# Patient Record
Sex: Male | Born: 1990 | Race: White | Hispanic: No | Marital: Single | Smoking: Current every day smoker
Health system: Southern US, Community
[De-identification: ages and names within clinical notes are randomized; demographics above are authoritative.]

## PROBLEM LIST (undated history)

## (undated) DIAGNOSIS — F32A Depression, unspecified: Secondary | ICD-10-CM

## (undated) DIAGNOSIS — E7800 Pure hypercholesterolemia, unspecified: Secondary | ICD-10-CM

## (undated) DIAGNOSIS — F329 Major depressive disorder, single episode, unspecified: Secondary | ICD-10-CM

## (undated) DIAGNOSIS — F419 Anxiety disorder, unspecified: Secondary | ICD-10-CM

## (undated) HISTORY — PX: KNEE SURGERY: SHX244

---

## 2013-11-20 ENCOUNTER — Encounter (HOSPITAL_BASED_OUTPATIENT_CLINIC_OR_DEPARTMENT_OTHER): Payer: Self-pay | Admitting: Emergency Medicine

## 2013-11-20 ENCOUNTER — Emergency Department (HOSPITAL_BASED_OUTPATIENT_CLINIC_OR_DEPARTMENT_OTHER)
Admission: EM | Admit: 2013-11-20 | Discharge: 2013-11-20 | Disposition: A | Discharge status: Home / Self Care | Payer: No Typology Code available for payment source | Attending: Emergency Medicine | Admitting: Emergency Medicine

## 2013-11-20 ENCOUNTER — Emergency Department (HOSPITAL_BASED_OUTPATIENT_CLINIC_OR_DEPARTMENT_OTHER): Payer: No Typology Code available for payment source

## 2013-11-20 DIAGNOSIS — S46909A Unspecified injury of unspecified muscle, fascia and tendon at shoulder and upper arm level, unspecified arm, initial encounter: Secondary | ICD-10-CM | POA: Insufficient documentation

## 2013-11-20 DIAGNOSIS — S79929A Unspecified injury of unspecified thigh, initial encounter: Secondary | ICD-10-CM

## 2013-11-20 DIAGNOSIS — Z8639 Personal history of other endocrine, nutritional and metabolic disease: Secondary | ICD-10-CM | POA: Insufficient documentation

## 2013-11-20 DIAGNOSIS — Z862 Personal history of diseases of the blood and blood-forming organs and certain disorders involving the immune mechanism: Secondary | ICD-10-CM | POA: Insufficient documentation

## 2013-11-20 DIAGNOSIS — Y9389 Activity, other specified: Secondary | ICD-10-CM | POA: Insufficient documentation

## 2013-11-20 DIAGNOSIS — M25511 Pain in right shoulder: Secondary | ICD-10-CM

## 2013-11-20 DIAGNOSIS — S79919A Unspecified injury of unspecified hip, initial encounter: Secondary | ICD-10-CM | POA: Insufficient documentation

## 2013-11-20 DIAGNOSIS — Y92410 Unspecified street and highway as the place of occurrence of the external cause: Secondary | ICD-10-CM | POA: Insufficient documentation

## 2013-11-20 DIAGNOSIS — S4980XA Other specified injuries of shoulder and upper arm, unspecified arm, initial encounter: Secondary | ICD-10-CM | POA: Insufficient documentation

## 2013-11-20 DIAGNOSIS — F17200 Nicotine dependence, unspecified, uncomplicated: Secondary | ICD-10-CM | POA: Insufficient documentation

## 2013-11-20 DIAGNOSIS — M25551 Pain in right hip: Secondary | ICD-10-CM

## 2013-11-20 DIAGNOSIS — Z79899 Other long term (current) drug therapy: Secondary | ICD-10-CM | POA: Insufficient documentation

## 2013-11-20 HISTORY — DX: Pure hypercholesterolemia, unspecified: E78.00

## 2013-11-20 MED ORDER — IBUPROFEN 200 MG PO TABS
600.0000 mg | ORAL_TABLET | Freq: Once | ORAL | Status: AC
  Administered 2013-11-20: 600 mg via ORAL
  Filled 2013-11-20 (×2): qty 1
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
## 2014-03-31 ENCOUNTER — Emergency Department (HOSPITAL_BASED_OUTPATIENT_CLINIC_OR_DEPARTMENT_OTHER)
Admission: EM | Admit: 2014-03-31 | Discharge: 2014-03-31 | Disposition: A | Discharge status: Home / Self Care | Payer: Medicaid Other | Attending: Emergency Medicine | Admitting: Emergency Medicine

## 2014-03-31 ENCOUNTER — Emergency Department (HOSPITAL_BASED_OUTPATIENT_CLINIC_OR_DEPARTMENT_OTHER): Payer: Medicaid Other

## 2014-03-31 ENCOUNTER — Encounter (HOSPITAL_BASED_OUTPATIENT_CLINIC_OR_DEPARTMENT_OTHER): Payer: Self-pay | Admitting: Emergency Medicine

## 2014-03-31 DIAGNOSIS — F17200 Nicotine dependence, unspecified, uncomplicated: Secondary | ICD-10-CM | POA: Diagnosis not present

## 2014-03-31 DIAGNOSIS — Z23 Encounter for immunization: Secondary | ICD-10-CM | POA: Insufficient documentation

## 2014-03-31 DIAGNOSIS — B353 Tinea pedis: Secondary | ICD-10-CM | POA: Insufficient documentation

## 2014-03-31 DIAGNOSIS — L03119 Cellulitis of unspecified part of limb: Secondary | ICD-10-CM

## 2014-03-31 DIAGNOSIS — L02619 Cutaneous abscess of unspecified foot: Secondary | ICD-10-CM | POA: Insufficient documentation

## 2014-03-31 DIAGNOSIS — E7800 Pure hypercholesterolemia, unspecified: Secondary | ICD-10-CM | POA: Insufficient documentation

## 2014-03-31 DIAGNOSIS — F3289 Other specified depressive episodes: Secondary | ICD-10-CM | POA: Insufficient documentation

## 2014-03-31 DIAGNOSIS — F411 Generalized anxiety disorder: Secondary | ICD-10-CM | POA: Diagnosis not present

## 2014-03-31 DIAGNOSIS — F329 Major depressive disorder, single episode, unspecified: Secondary | ICD-10-CM | POA: Diagnosis not present

## 2014-03-31 DIAGNOSIS — Z79899 Other long term (current) drug therapy: Secondary | ICD-10-CM | POA: Diagnosis not present

## 2014-03-31 HISTORY — DX: Anxiety disorder, unspecified: F41.9

## 2014-03-31 HISTORY — DX: Major depressive disorder, single episode, unspecified: F32.9

## 2014-03-31 HISTORY — DX: Depression, unspecified: F32.A

## 2014-03-31 LAB — BASIC METABOLIC PANEL
ANION GAP: 11 (ref 5–15)
BUN: 12 mg/dL (ref 6–23)
CHLORIDE: 101 meq/L (ref 96–112)
CO2: 25 meq/L (ref 19–32)
Calcium: 9.5 mg/dL (ref 8.4–10.5)
Creatinine, Ser: 0.8 mg/dL (ref 0.50–1.35)
GFR calc Af Amer: >90 mL/min (ref >90)
GFR calc non Af Amer: >90 mL/min (ref >90)
Glucose, Bld: 92 mg/dL (ref 70–99)
Potassium: 4.3 mEq/L (ref 3.7–5.3)
Sodium: 137 mEq/L (ref 137–147)

## 2014-03-31 LAB — CBC
HEMATOCRIT: 40.4 % (ref 39.0–52.0)
Hemoglobin: 13.9 g/dL (ref 13.0–17.0)
MCH: 32.7 pg (ref 26.0–34.0)
MCHC: 34.4 g/dL (ref 30.0–36.0)
MCV: 95.1 fL (ref 78.0–100.0)
Platelets: 320 10*3/uL (ref 150–400)
RBC: 4.25 MIL/uL (ref 4.22–5.81)
RDW: 12.4 % (ref 11.5–15.5)
WBC: 16.1 10*3/uL — AB (ref 4.0–10.5)

## 2014-03-31 MED ORDER — CLINDAMYCIN PHOSPHATE 600 MG/50ML IV SOLN
600.0000 mg | Freq: Once | INTRAVENOUS | Status: AC
  Administered 2014-03-31: 600 mg via INTRAVENOUS
  Filled 2014-03-31: qty 50

## 2014-03-31 MED ORDER — HYDROCODONE-ACETAMINOPHEN 5-325 MG PO TABS
2.0000 | ORAL_TABLET | Freq: Once | ORAL | Status: AC
  Administered 2014-03-31: 2 via ORAL
  Filled 2014-03-31: qty 2

## 2014-03-31 MED ORDER — TETANUS-DIPHTH-ACELL PERTUSSIS 5-2.5-18.5 LF-MCG/0.5 IM SUSP
0.5000 mL | Freq: Once | INTRAMUSCULAR | Status: AC
  Administered 2014-03-31: 0.5 mL via INTRAMUSCULAR
  Filled 2014-03-31: qty 0.5

## 2014-03-31 MED ORDER — CLINDAMYCIN HCL 300 MG PO CAPS: 300.0000 mg | ORAL_CAPSULE | Freq: Three times a day (TID) | ORAL | Status: DC

## 2014-03-31 MED ORDER — HYDROCODONE-ACETAMINOPHEN 5-325 MG PO TABS: 1.0000 | ORAL_TABLET | ORAL | Status: DC | PRN

## 2014-03-31 NOTE — Discharge Instructions (Signed)
Return here as needed for worsening redness or swelling Cellulitis Cellulitis is an infection of the skin and the tissue beneath it. The infected area is usually red and tender. Cellulitis occurs most often in the arms and lower legs.  CAUSES  Cellulitis is caused by bacteria that enter the skin through cracks or cuts in the skin. The most common types of bacteria that cause cellulitis are staphylococci and streptococci. SIGNS AND SYMPTOMS   Redness and warmth.  Swelling.  Tenderness or pain.  Fever. DIAGNOSIS  Your health care provider can usually determine what is wrong based on a physical exam. Blood tests may also be done. TREATMENT  Treatment usually involves taking an antibiotic medicine. HOME CARE INSTRUCTIONS   Take your antibiotic medicine as directed by your health care provider. Finish the antibiotic even if you start to feel better.  Keep the infected arm or leg elevated to reduce swelling.  Apply a warm cloth to the affected area up to 4 times per day to relieve pain.  Take medicines only as directed by your health care provider.  Keep all follow-up visits as directed by your health care provider. SEEK MEDICAL CARE IF:   You notice red streaks coming from the infected area.  Your red area gets larger or turns dark in color.  Your bone or joint underneath the infected area becomes painful after the skin has healed.  Your infection returns in the same area or another area.  You notice a swollen bump in the infected area.  You develop new symptoms.  You have a fever. SEEK IMMEDIATE MEDICAL CARE IF:   You feel very sleepy.  You develop vomiting or diarrhea.  You have a general ill feeling (malaise) with muscle aches and pains. MAKE SURE YOU:   Understand these instructions.  Will watch your condition.  Will get help right away if you are not doing well or get worse. Document Released: 03/30/2005 Document Revised: 11/04/2013 Document Reviewed:  09/05/2011 [facility] Patient Information 2015 [place], [place]. This information is not intended to replace advice given to you by your health care provider. Make sure you discuss any questions you have with your health care provider.

## 2014-03-31 NOTE — ED Provider Notes (Signed)
Medical screening examination/treatment/procedure(s) were performed by non-physician practitioner and as supervising physician I was immediately available for consultation/collaboration.   EKG Interpretation None       Ethelda Chick, MD 03/31/14 1239

## 2014-03-31 NOTE — ED Provider Notes (Signed)
CSN: [id number]     Arrival date & time 03/31/14  4098 History   First MD Initiated Contact with Patient 03/31/14 1034     Chief Complaint  Patient presents with  . Cellulitis     (Consider location/radiation/quality/duration/timing/severity/associated sxs/prior Treatment) HPI Comments: Pt states that his left foot developed redness swelling and warmth about 3 days ago. Pt states that he had bad athletes foot and he scratched the area. Denies diabetes. States that he also had a staple go in his left heel a couple of days ago. No history of similar symptoms  The history is provided by the patient. No language interpreter was used.    Past Medical History  Diagnosis Date  . High cholesterol   . Depression   . Anxiety    Past Surgical History  Procedure Laterality Date  . Knee surgery     No family history on file. History  Substance Use Topics  . Smoking status: Current Every Day Smoker    Types: Cigarettes  . Smokeless tobacco: Never Used  . Alcohol Use: No    Review of Systems  Constitutional: Negative.   Respiratory: Negative.   Cardiovascular: Negative.   All other systems reviewed and are negative.     Allergies  Bactrim; Imitrex; and Robaxin  Home Medications   Prior to Admission medications   Medication Sig Start Date End Date Taking? Authorizing Provider  ALPRAZolam Prudy Feeler) 0.5 MG tablet Take 0.5 mg by mouth 2 (two) times daily.    Historical Provider, MD  PARoxetine HCl (PAXIL PO) Take 50 mg by mouth daily.    Historical Provider, MD   BP 131/65  Pulse 75  Temp(Src) 98.5 F (36.9 C) (Oral)  Resp 16  Ht  (1.854 m)  Wt 145 lb (65.772 kg)  BMI 19.13 kg/m2  SpO2 99% Physical Exam  Nursing note and vitals reviewed. Constitutional: He is oriented to person, place, and time. He appears well-developed and well-nourished.  Cardiovascular: Normal rate and regular rhythm.   Pulmonary/Chest: Effort normal and breath sounds normal.  Musculoskeletal:  He exhibits tenderness.  Neurological: He is alert and oriented to person, place, and time. He exhibits normal muscle tone. Coordination normal.  Skin:  Obvious swelling and redness noted to the top of the left foot that originate between the third and fourth toe. No drainage noted. Pulses intact    ED Course  Procedures (including critical care time) Labs Review Labs Reviewed  CBC  BASIC METABOLIC PANEL    Imaging Review No results found.   EKG Interpretation None      MDM   Final diagnoses:  Cellulitis of foot   History of exam consistent with cellulitis. Discussed return precautions with pt. Will send home on clinda and hydrocodone    Teressa Lower, NP 03/31/14 1231

## 2014-03-31 NOTE — ED Notes (Signed)
Pt has red, swollen, inflammed area on top of left foot.  Pt states he scratched area and also stepped on a staple.  Pt states it is very painful.

## 2015-01-14 ENCOUNTER — Ambulatory Visit (HOSPITAL_BASED_OUTPATIENT_CLINIC_OR_DEPARTMENT_OTHER)
Admission: EM | Admit: 2015-01-14 | Discharge: 2015-01-14 | Disposition: A | Discharge status: Home / Self Care | Payer: Self-pay | Attending: Emergency Medicine | Admitting: Emergency Medicine

## 2015-01-14 ENCOUNTER — Emergency Department (HOSPITAL_COMMUNITY): Payer: Medicaid Other | Admitting: Certified Registered"

## 2015-01-14 ENCOUNTER — Emergency Department (HOSPITAL_BASED_OUTPATIENT_CLINIC_OR_DEPARTMENT_OTHER): Payer: Self-pay

## 2015-01-14 ENCOUNTER — Encounter (HOSPITAL_BASED_OUTPATIENT_CLINIC_OR_DEPARTMENT_OTHER): Payer: Self-pay

## 2015-01-14 ENCOUNTER — Encounter (HOSPITAL_COMMUNITY)
Admission: EM | Disposition: A | Discharge status: Home / Self Care | Payer: Self-pay | Source: Home / Self Care | Attending: Emergency Medicine

## 2015-01-14 ENCOUNTER — Emergency Department (HOSPITAL_COMMUNITY): Payer: Self-pay | Admitting: Certified Registered"

## 2015-01-14 DIAGNOSIS — F329 Major depressive disorder, single episode, unspecified: Secondary | ICD-10-CM | POA: Insufficient documentation

## 2015-01-14 DIAGNOSIS — F419 Anxiety disorder, unspecified: Secondary | ICD-10-CM | POA: Insufficient documentation

## 2015-01-14 DIAGNOSIS — M65141 Other infective (teno)synovitis, right hand: Secondary | ICD-10-CM | POA: Insufficient documentation

## 2015-01-14 DIAGNOSIS — M7989 Other specified soft tissue disorders: Secondary | ICD-10-CM

## 2015-01-14 DIAGNOSIS — F17210 Nicotine dependence, cigarettes, uncomplicated: Secondary | ICD-10-CM | POA: Insufficient documentation

## 2015-01-14 HISTORY — PX: I & D EXTREMITY: SHX5045

## 2015-01-14 LAB — CBC
HEMATOCRIT: 43.7 % (ref 39.0–52.0)
Hemoglobin: 15 g/dL (ref 13.0–17.0)
MCH: 32.4 pg (ref 26.0–34.0)
MCHC: 34.3 g/dL (ref 30.0–36.0)
MCV: 94.4 fL (ref 78.0–100.0)
PLATELETS: 320 10*3/uL (ref 150–400)
RBC: 4.63 MIL/uL (ref 4.22–5.81)
RDW: 12.3 % (ref 11.5–15.5)
WBC: 11.3 10*3/uL — AB (ref 4.0–10.5)

## 2015-01-14 LAB — GRAM STAIN

## 2015-01-14 LAB — BASIC METABOLIC PANEL
ANION GAP: 8 (ref 5–15)
BUN: 12 mg/dL (ref 6–20)
CALCIUM: 9.7 mg/dL (ref 8.9–10.3)
CO2: 28 mmol/L (ref 22–32)
Chloride: 103 mmol/L (ref 101–111)
Creatinine, Ser: 0.89 mg/dL (ref 0.61–1.24)
GFR calc Af Amer: >60 mL/min (ref >60)
GFR calc non Af Amer: >60 mL/min (ref >60)
Glucose, Bld: 100 mg/dL — ABNORMAL HIGH (ref 65–99)
Potassium: 4.4 mmol/L (ref 3.5–5.1)
Sodium: 139 mmol/L (ref 135–145)

## 2015-01-14 SURGERY — IRRIGATION AND DEBRIDEMENT EXTREMITY
Anesthesia: General | Site: Hand | Laterality: Right

## 2015-01-14 MED ORDER — HYDROMORPHONE HCL 1 MG/ML IJ SOLN
INTRAMUSCULAR | Status: AC
  Filled 2015-01-14: qty 1

## 2015-01-14 MED ORDER — MORPHINE SULFATE 4 MG/ML IJ SOLN
4.0000 mg | Freq: Once | INTRAMUSCULAR | Status: AC
  Administered 2015-01-14: 4 mg via INTRAVENOUS
  Filled 2015-01-14: qty 1

## 2015-01-14 MED ORDER — ONDANSETRON HCL 4 MG/2ML IJ SOLN
INTRAMUSCULAR | Status: AC
  Filled 2015-01-14: qty 2

## 2015-01-14 MED ORDER — LACTATED RINGERS IV SOLN
INTRAVENOUS | Status: DC | PRN
  Administered 2015-01-14: 18:00:00 via INTRAVENOUS

## 2015-01-14 MED ORDER — PROPOFOL 10 MG/ML IV BOLUS
INTRAVENOUS | Status: AC
  Filled 2015-01-14: qty 20

## 2015-01-14 MED ORDER — OXYCODONE HCL 5 MG PO TABS
5.0000 mg | ORAL_TABLET | Freq: Once | ORAL | Status: AC | PRN
  Administered 2015-01-14: 5 mg via ORAL

## 2015-01-14 MED ORDER — SODIUM CHLORIDE 0.9 % IR SOLN
Status: DC | PRN
  Administered 2015-01-14: 1000 mL

## 2015-01-14 MED ORDER — LIDOCAINE HCL (CARDIAC) 20 MG/ML IV SOLN
INTRAVENOUS | Status: DC | PRN
  Administered 2015-01-14: 60 mg via INTRAVENOUS

## 2015-01-14 MED ORDER — LIDOCAINE HCL (CARDIAC) 20 MG/ML IV SOLN
INTRAVENOUS | Status: AC
  Filled 2015-01-14: qty 5

## 2015-01-14 MED ORDER — BUPIVACAINE HCL (PF) 0.25 % IJ SOLN
INTRAMUSCULAR | Status: DC | PRN
  Administered 2015-01-14: 3.5 mL

## 2015-01-14 MED ORDER — FENTANYL CITRATE (PF) 250 MCG/5ML IJ SOLN
INTRAMUSCULAR | Status: AC
  Filled 2015-01-14: qty 5

## 2015-01-14 MED ORDER — OXYCODONE HCL 5 MG PO TABS
ORAL_TABLET | ORAL | Status: AC
  Filled 2015-01-14: qty 1

## 2015-01-14 MED ORDER — PROMETHAZINE HCL 25 MG/ML IJ SOLN: 6.2500 mg | INTRAMUSCULAR | Status: DC | PRN

## 2015-01-14 MED ORDER — OXYCODONE HCL 5 MG/5ML PO SOLN: 5.0000 mg | Freq: Once | ORAL | Status: AC | PRN

## 2015-01-14 MED ORDER — MIDAZOLAM HCL 5 MG/5ML IJ SOLN
INTRAMUSCULAR | Status: DC | PRN
  Administered 2015-01-14: 2 mg via INTRAVENOUS

## 2015-01-14 MED ORDER — HYDROMORPHONE HCL 1 MG/ML IJ SOLN
0.2500 mg | INTRAMUSCULAR | Status: DC | PRN
  Administered 2015-01-14 (×4): 0.5 mg via INTRAVENOUS

## 2015-01-14 MED ORDER — FENTANYL CITRATE (PF) 100 MCG/2ML IJ SOLN
INTRAMUSCULAR | Status: DC | PRN
  Administered 2015-01-14: 50 ug via INTRAVENOUS
  Administered 2015-01-14 (×2): 100 ug via INTRAVENOUS

## 2015-01-14 MED ORDER — OXYCODONE-ACETAMINOPHEN 5-325 MG PO TABS: 1.0000 | ORAL_TABLET | ORAL | Status: DC | PRN

## 2015-01-14 MED ORDER — PROPOFOL 10 MG/ML IV BOLUS
INTRAVENOUS | Status: DC | PRN
  Administered 2015-01-14: 200 mg via INTRAVENOUS

## 2015-01-14 MED ORDER — MIDAZOLAM HCL 2 MG/2ML IJ SOLN
INTRAMUSCULAR | Status: AC
  Filled 2015-01-14: qty 2

## 2015-01-14 MED ORDER — BUPIVACAINE HCL (PF) 0.25 % IJ SOLN
INTRAMUSCULAR | Status: AC
  Filled 2015-01-14: qty 30

## 2015-01-14 MED ORDER — ARTIFICIAL TEARS OP OINT
TOPICAL_OINTMENT | OPHTHALMIC | Status: AC
  Filled 2015-01-14: qty 3.5

## 2015-01-14 MED ORDER — ONDANSETRON HCL 4 MG/2ML IJ SOLN
INTRAMUSCULAR | Status: DC | PRN
  Administered 2015-01-14: 4 mg via INTRAVENOUS

## 2015-01-14 SURGICAL SUPPLY — 51 items
BANDAGE ELASTIC 3 VELCRO ST LF (GAUZE/BANDAGES/DRESSINGS) IMPLANT
BANDAGE ELASTIC 4 VELCRO ST LF (GAUZE/BANDAGES/DRESSINGS) IMPLANT
BNDG CMPR 9X4 STRL LF SNTH (GAUZE/BANDAGES/DRESSINGS) ×1
BNDG COHESIVE 1X5 TAN STRL LF (GAUZE/BANDAGES/DRESSINGS) ×3 IMPLANT
BNDG CONFORM 2 STRL LF (GAUZE/BANDAGES/DRESSINGS) IMPLANT
BNDG ESMARK 4X9 LF (GAUZE/BANDAGES/DRESSINGS) ×3 IMPLANT
BNDG GAUZE ELAST 4 BULKY (GAUZE/BANDAGES/DRESSINGS) IMPLANT
CORDS BIPOLAR (ELECTRODE) ×3 IMPLANT
COVER SURGICAL LIGHT HANDLE (MISCELLANEOUS) ×3 IMPLANT
CUFF TOURNIQUET SINGLE 18IN (TOURNIQUET CUFF) ×3 IMPLANT
DECANTER SPIKE VIAL GLASS SM (MISCELLANEOUS) IMPLANT
DRAIN PENROSE 1/4X12 LTX STRL (WOUND CARE) IMPLANT
DRAPE SURG 17X23 STRL (DRAPES) IMPLANT
DRSG PAD ABDOMINAL 8X10 ST (GAUZE/BANDAGES/DRESSINGS) IMPLANT
DURAPREP 26ML APPLICATOR (WOUND CARE) ×3 IMPLANT
ELECT REM PT RETURN 9FT ADLT (ELECTROSURGICAL)
ELECTRODE REM PT RTRN 9FT ADLT (ELECTROSURGICAL) IMPLANT
GAUZE PACKING IODOFORM 1/4X5 (PACKING) IMPLANT
GAUZE SPONGE 4X4 12PLY STRL (GAUZE/BANDAGES/DRESSINGS) IMPLANT
GAUZE XEROFORM 1X8 LF (GAUZE/BANDAGES/DRESSINGS) ×3 IMPLANT
GLOVE SURG SYN 8.0 (GLOVE) ×3 IMPLANT
GOWN STRL REUS W/ TWL LRG LVL3 (GOWN DISPOSABLE) ×1 IMPLANT
GOWN STRL REUS W/ TWL XL LVL3 (GOWN DISPOSABLE) ×1 IMPLANT
GOWN STRL REUS W/TWL LRG LVL3 (GOWN DISPOSABLE) ×3
GOWN STRL REUS W/TWL XL LVL3 (GOWN DISPOSABLE) ×3
HANDPIECE INTERPULSE COAX TIP (DISPOSABLE)
KIT BASIN OR (CUSTOM PROCEDURE TRAY) ×3 IMPLANT
KIT ROOM TURNOVER OR (KITS) ×3 IMPLANT
MANIFOLD NEPTUNE II (INSTRUMENTS) IMPLANT
NEEDLE HYPO 25GX1X1/2 BEV (NEEDLE) IMPLANT
NEEDLE HYPO 25X1 1.5 SAFETY (NEEDLE) ×3 IMPLANT
NS IRRIG 1000ML POUR BTL (IV SOLUTION) ×3 IMPLANT
PACK ORTHO EXTREMITY (CUSTOM PROCEDURE TRAY) ×3 IMPLANT
PAD ARMBOARD 7.5X6 YLW CONV (MISCELLANEOUS) ×6 IMPLANT
PAD CAST 4YDX4 CTTN HI CHSV (CAST SUPPLIES) IMPLANT
PADDING CAST COTTON 4X4 STRL (CAST SUPPLIES)
SET HNDPC FAN SPRY TIP SCT (DISPOSABLE) IMPLANT
SPONGE GAUZE 4X4 12PLY STER LF (GAUZE/BANDAGES/DRESSINGS) ×3 IMPLANT
SPONGE LAP 18X18 X RAY DECT (DISPOSABLE) IMPLANT
SUCTION FRAZIER TIP 10 FR DISP (SUCTIONS) ×3 IMPLANT
SUT VICRYL RAPIDE 4/0 PS 2 (SUTURE) IMPLANT
SYR 20CC LL (SYRINGE) IMPLANT
SYR CONTROL 10ML LL (SYRINGE) ×3 IMPLANT
TOWEL OR 17X24 6PK STRL BLUE (TOWEL DISPOSABLE) ×3 IMPLANT
TOWEL OR 17X26 10 PK STRL BLUE (TOWEL DISPOSABLE) ×3 IMPLANT
TUBE ANAEROBIC SPECIMEN COL (MISCELLANEOUS) ×3 IMPLANT
TUBE CONNECTING 12'X1/4 (SUCTIONS) ×1
TUBE CONNECTING 12X1/4 (SUCTIONS) ×2 IMPLANT
UNDERPAD 30X30 INCONTINENT (UNDERPADS AND DIAPERS) ×3 IMPLANT
WATER STERILE IRR 1000ML POUR (IV SOLUTION) IMPLANT
YANKAUER SUCT BULB TIP NO VENT (SUCTIONS) IMPLANT

## 2015-01-14 NOTE — ED Notes (Signed)
Patient transported to X-ray ambulatory with tech. 

## 2015-01-14 NOTE — Transfer of Care (Signed)
3Immediate Anesthesia Transfer of Care Note  Patient: Keith Daugherty  Procedure(s) Performed: Procedure(s): Incision and Drainage RIGHT INDEX FINGER (Right)  Patient Location: PACU  Anesthesia Type:General  Level of Consciousness: awake, alert , oriented and patient cooperative  Airway & Oxygen Therapy: Patient Spontanous Breathing  Post-op Assessment: Report given to RN, Post -op Vital signs reviewed and stable, Patient moving all extremities and Patient moving all extremities X 4  Post vital signs: Reviewed and stable  Last Vitals:  Filed Vitals:   01/14/15 1900  BP:   Pulse:   Temp: 36.9 C  Resp:     Complications: No apparent anesthesia complications

## 2015-01-14 NOTE — ED Provider Notes (Signed)
Patient transferred here from [facility] with infection in the right second finger here to see Dr. Mina MarbleWeingold. He is resting comfortably and in no acute distress. He is noted to have significant swelling of the volar surface of the right second finger middle phalanx. Dr. Mina MarbleWeingold will come to see the patient.  Dione Boozeavid Lamika Connolly, MD 01/14/15 1739
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
## 2015-01-14 NOTE — ED Notes (Signed)
Pt will be [facility] Ed at [facility] by private [facility].

## 2015-01-14 NOTE — ED Provider Notes (Signed)
CSN: [id number]     Arrival date & time 01/14/15  1336 History   First MD Initiated Contact with Patient 01/14/15 1352     Chief Complaint  Patient presents with  . Finger Injury     (Consider location/radiation/quality/duration/timing/severity/associated sxs/prior Treatment) HPI Comments: 24 y/o M complaining of right index finger pain and swelling 2 months. States he was working on a garage and believes he got a piece of metal or fiberglass in his finger. States he could not miss work to be evaluated initially. He has been taking leftover penicillin that he had at home (completed yesterday) and ibuprofen with minimal relief. Reports pain when he flexes and extends his finger, rated 6/10. About a week ago he had a small amount of purulent drainage. Admits to intermittent subjective fevers.  Pt's sig other in exam room interrupting and stating different story about timing of when he last ate (she states last night, pt states this morning) and fevers (he states he did not check it, she states it was 102).  The history is provided by the patient.    Past Medical History  Diagnosis Date  . High cholesterol   . Depression   . Anxiety    Past Surgical History  Procedure Laterality Date  . Knee surgery     No family history on file. History  Substance Use Topics  . Smoking status: Current Every Day Smoker    Types: Cigarettes  . Smokeless tobacco: Never Used  . Alcohol Use: No    Review of Systems  Constitutional: Positive for fever.  Musculoskeletal:       + R index finger pain and swelling.  All other systems reviewed and are negative.     Allergies  Bactrim; Imitrex; Robaxin; and Toradol  Home Medications   Prior to Admission medications   Medication Sig Start Date End Date Taking? Authorizing Provider  ALPRAZolam Prudy Feeler) 0.5 MG tablet Take 0.5 mg by mouth 2 (two) times daily.    Historical Provider, MD  PARoxetine HCl (PAXIL PO) Take 50 mg by mouth daily.     Historical Provider, MD   BP 129/91 mmHg  Pulse 97  Temp(Src) 98.9 F (37.2 C) (Oral)  Resp 16  Ht  (1.88 m)  Wt 148 lb (67.132 kg)  BMI 18.99 kg/m2  SpO2 99% Physical Exam  Constitutional: He is oriented to person, place, and time. He appears well-developed and well-nourished. No distress.  HENT:  Head: Normocephalic and atraumatic.  Eyes: Conjunctivae and EOM are normal.  Neck: Normal range of motion. Neck supple.  Cardiovascular: Normal rate, regular rhythm and normal heart sounds.   Pulmonary/Chest: Effort normal and breath sounds normal.  Musculoskeletal:  Area of swelling to palmar aspect of R index finger proximal phalanx. Slight fluctuance. Central callous appearance. Able to fully flex and extend finger with pain. Pain increased against resistance of flexion. Cap refill < 2 seconds.  Neurological: He is alert and oriented to person, place, and time.  Skin: Skin is warm and dry.  Psychiatric: He has a normal mood and affect. His behavior is normal.  Nursing note and vitals reviewed.   ED Course  Procedures (including critical care time) Labs Review Labs Reviewed  CBC  BASIC METABOLIC PANEL    Imaging Review Dg Finger Index Right  01/14/2015   CLINICAL DATA:  Finger injury 2 months ago, possible foreign body  EXAM: RIGHT INDEX FINGER 2+V  COMPARISON:  None.  FINDINGS: Three views of the right second  finger submitted. There is soft tissue swelling adjacent to proximal phalanx. No acute fracture or subluxation. No radiopaque foreign body is identified.  IMPRESSION: No acute fracture or subluxation. Soft tissue swelling adjacent to proximal phalanx. No radiopaque foreign body is identified.   Electronically Signed   By: Natasha MeadLiviu  Pop M.D.   On: 01/14/2015 14:05     EKG Interpretation None      MDM   Final diagnoses:  Swelling of finger, right   Non-toxic appearing, NAD. AFVSS. No FB seen on xray. No FB palpated on exam. Large area of swelling to proximal  phalanx ventrally. Pain against resistance with flexion. Cannot rule out flexor tendon involvement. I spoke with Dr. Mina MarbleWeingold who suggests transferring pt to [facility] ED for eval. CBC, BMP pending. Pt will go private vehicle. I spoke with Dr. Juleen ChinaKohut who accepts pt in transfer. Pt NPO.  Discussed with attending Dr. Madilyn Hookees who also evaluated patient and agrees with plan of care.  Kathrynn SpeedRobyn M Isadora Delorey, PA-C 01/14/15 1540  [address]obyn M Madelynn Malson, PA-C 01/14/15 1546  Tilden FossaElizabeth Rees, MD 01/15/15 [phone]

## 2015-01-14 NOTE — Anesthesia Postprocedure Evaluation (Signed)
  Anesthesia Post-op Note  Patient: Keith Daugherty  Procedure(s) Performed: Procedure(s): Incision and Drainage RIGHT INDEX FINGER (Right)  Patient Location: PACU  Anesthesia Type:General  Level of Consciousness: awake, alert  and oriented  Airway and Oxygen Therapy: Patient Spontanous Breathing  Post-op Pain: mild  Post-op Assessment: Post-op Vital signs reviewed              Post-op Vital Signs: Reviewed  Last Vitals:  Filed Vitals:   01/14/15 1945  BP: 133/80  Pulse: 74  Temp: 36.7 C  Resp: 20    Complications: No apparent anesthesia complications

## 2015-01-14 NOTE — ED Notes (Signed)
Pt made aware of awaiting return phone call from hand surgery.

## 2015-01-14 NOTE — Op Note (Signed)
See note [id number]

## 2015-01-14 NOTE — Anesthesia Procedure Notes (Signed)
Procedure Name: LMA Insertion Date/Time: 01/14/2015 6:29 PM Performed by: Jefm MilesENNIE, Xaria Judon E Pre-anesthesia Checklist: Patient identified, Emergency Drugs available, Suction available, Patient being monitored and Timeout performed Patient Re-evaluated:Patient Re-evaluated prior to inductionOxygen Delivery Method: Circle system utilized Preoxygenation: Pre-oxygenation with 100% oxygen Ventilation: Mask ventilation without difficulty LMA: LMA inserted LMA Size: 5.0 Number of attempts: 1 Placement Confirmation: positive ETCO2 and breath sounds checked- equal and bilateral Tube secured with: Tape Dental Injury: Teeth and Oropharynx as per pre-operative assessment

## 2015-01-14 NOTE — ED Notes (Signed)
Right index finger injury and possible metal in finger x 2 months

## 2015-01-14 NOTE — ED Notes (Signed)
Report called to MusicianJessica RN at [facility].

## 2015-01-14 NOTE — Anesthesia Preprocedure Evaluation (Addendum)
Anesthesia Evaluation  Patient identified by MRN, date of birth, ID band Patient awake    Reviewed: Allergy & Precautions, NPO status , Patient's Chart, lab work & pertinent test results  Airway Mallampati: I  TM Distance: >3 FB Neck ROM: Full    Dental  (+) Teeth Intact, Dental Advisory Given   Pulmonary Current Smoker,  breath sounds clear to auscultation        Cardiovascular negative cardio ROS  Rhythm:Regular Rate:Normal     Neuro/Psych Anxiety Depression negative neurological ROS     GI/Hepatic negative GI ROS, Neg liver ROS,   Endo/Other  negative endocrine ROS  Renal/GU negative Renal ROS     Musculoskeletal   Abdominal   Peds  Hematology negative hematology ROS (+)   Anesthesia Other Findings   Reproductive/Obstetrics                           Anesthesia Physical Anesthesia Plan  ASA: II and emergent  Anesthesia Plan: General   Post-op Pain Management:    Induction: Intravenous  Airway Management Planned: LMA  Additional Equipment:   Intra-op Plan:   Post-operative Plan: Extubation in OR  Informed Consent: I have reviewed the patients History and Physical, chart, labs and discussed the procedure including the risks, benefits and alternatives for the proposed anesthesia with the patient or authorized representative who has indicated his/her understanding and acceptance.   Dental advisory given  Plan Discussed with: CRNA  Anesthesia Plan Comments:        Anesthesia Quick Evaluation

## 2015-01-15 ENCOUNTER — Encounter (HOSPITAL_COMMUNITY): Payer: Self-pay | Admitting: Orthopedic Surgery

## 2015-01-15 NOTE — Op Note (Signed)
NAMEduard Daugherty:  Keith Daugherty, Keith Daugherty               ACCOUNT NO.:  [id number]  MEDICAL RECORD NO.:  [id number]  LOCATION:  [facility]                         FACILITY:  [facility]  PHYSICIAN:  Artist PaisMatthew A. Mar Walmer, M.D.DATE OF BIRTH:  [birth year]  DATE OF PROCEDURE:  01/14/2015 DATE OF DISCHARGE:  01/14/2015                              OPERATIVE REPORT   PREOPERATIVE DIAGNOSIS:  Right index finger flexor sheath infection.  POSTOPERATIVE DIAGNOSIS:  Right index finger flexor sheath infection.  PROCEDURE:  Incision and drainage of right index finger flexor sheath.  SURGEON:  Artist PaisMatthew A. Mina MarbleWeingold, M.D.  ASSISTANT:  None.  ANESTHESIA:  General.  COMPLICATIONS:  No complications.  DRAINS:  No drains.  CULTURES:  X2 and Gram stain sent.  DESCRIPTION OF PROCEDURE:  The patient was taken to the operating suite. After induction of adequate general anesthetic, right upper extremity was prepped and draped in usual sterile fashion.  An Esmarch was used to exsanguinate the limb.  Tourniquet was inflated to 250 mmHg.  At this point in time, an oblique incision was made at the index finger on the right side, trying proximal radial and going distal ulna from the MP flexion crease to the PIP flexion crease.  Skin was incised.  Purulence was encountered over the flexor sheath.  Neurovascular bundle was identified and retracted.  There was a small bit of infection in the distal aspect of flexor sheath distal to the A2 __________ A2 and A4. This was carefully irrigated out with a liter of normal saline.  We fully flexed and extended the finger.  There was no other obvious __________ infection.  The wound was then packed open with 1 x 8 Xeroform gauze, 4x4s and Coban wrap.  The patient tolerated the procedure well, went to the recovery room in stable fashion.     Artist PaisMatthew A. Mina MarbleWeingold, M.D.    MAW/MEDQ  D:  01/14/2015  T:  01/15/2015  Job:  [id number]

## 2015-01-16 LAB — WOUND CULTURE

## 2015-01-19 LAB — ANAEROBIC CULTURE

## 2015-03-11 ENCOUNTER — Emergency Department (HOSPITAL_COMMUNITY): Payer: Medicaid Other

## 2015-03-11 ENCOUNTER — Encounter (HOSPITAL_COMMUNITY): Payer: Self-pay | Admitting: Emergency Medicine

## 2015-03-11 ENCOUNTER — Emergency Department (HOSPITAL_COMMUNITY)
Admission: EM | Admit: 2015-03-11 | Discharge: 2015-03-11 | Disposition: A | Discharge status: Home / Self Care | Payer: Medicaid Other | Attending: Emergency Medicine | Admitting: Emergency Medicine

## 2015-03-11 DIAGNOSIS — W19XXXA Unspecified fall, initial encounter: Secondary | ICD-10-CM

## 2015-03-11 DIAGNOSIS — E782 Mixed hyperlipidemia: Secondary | ICD-10-CM | POA: Insufficient documentation

## 2015-03-11 DIAGNOSIS — Z720 Tobacco use: Secondary | ICD-10-CM | POA: Insufficient documentation

## 2015-03-11 DIAGNOSIS — M25551 Pain in right hip: Secondary | ICD-10-CM

## 2015-03-11 DIAGNOSIS — S6991XA Unspecified injury of right wrist, hand and finger(s), initial encounter: Secondary | ICD-10-CM | POA: Insufficient documentation

## 2015-03-11 DIAGNOSIS — Y998 Other external cause status: Secondary | ICD-10-CM | POA: Insufficient documentation

## 2015-03-11 DIAGNOSIS — F419 Anxiety disorder, unspecified: Secondary | ICD-10-CM | POA: Insufficient documentation

## 2015-03-11 DIAGNOSIS — S79911A Unspecified injury of right hip, initial encounter: Secondary | ICD-10-CM | POA: Insufficient documentation

## 2015-03-11 DIAGNOSIS — M25531 Pain in right wrist: Secondary | ICD-10-CM

## 2015-03-11 DIAGNOSIS — Y9289 Other specified places as the place of occurrence of the external cause: Secondary | ICD-10-CM | POA: Insufficient documentation

## 2015-03-11 DIAGNOSIS — Z79899 Other long term (current) drug therapy: Secondary | ICD-10-CM | POA: Insufficient documentation

## 2015-03-11 DIAGNOSIS — S50811A Abrasion of right forearm, initial encounter: Secondary | ICD-10-CM | POA: Insufficient documentation

## 2015-03-11 DIAGNOSIS — S3992XA Unspecified injury of lower back, initial encounter: Secondary | ICD-10-CM | POA: Insufficient documentation

## 2015-03-11 DIAGNOSIS — Y9389 Activity, other specified: Secondary | ICD-10-CM | POA: Insufficient documentation

## 2015-03-11 DIAGNOSIS — F329 Major depressive disorder, single episode, unspecified: Secondary | ICD-10-CM | POA: Insufficient documentation

## 2015-03-11 DIAGNOSIS — W11XXXA Fall on and from ladder, initial encounter: Secondary | ICD-10-CM | POA: Insufficient documentation

## 2015-03-11 LAB — COMPREHENSIVE METABOLIC PANEL
ALT: 29 U/L (ref 17–63)
AST: 37 U/L (ref 15–41)
Albumin: 4.4 g/dL (ref 3.5–5.0)
Alkaline Phosphatase: 78 U/L (ref 38–126)
Anion gap: 9 (ref 5–15)
BILIRUBIN TOTAL: 0.6 mg/dL (ref 0.3–1.2)
BUN: 8 mg/dL (ref 6–20)
CHLORIDE: 103 mmol/L (ref 101–111)
CO2: 26 mmol/L (ref 22–32)
CREATININE: 0.95 mg/dL (ref 0.61–1.24)
Calcium: 9.7 mg/dL (ref 8.9–10.3)
Glucose, Bld: 91 mg/dL (ref 65–99)
Potassium: 3.5 mmol/L (ref 3.5–5.1)
Sodium: 138 mmol/L (ref 135–145)
TOTAL PROTEIN: 7.3 g/dL (ref 6.5–8.1)

## 2015-03-11 LAB — ETHANOL

## 2015-03-11 LAB — CBC
HCT: 40.8 % (ref 39.0–52.0)
Hemoglobin: 14 g/dL (ref 13.0–17.0)
MCH: 32.2 pg (ref 26.0–34.0)
MCHC: 34.3 g/dL (ref 30.0–36.0)
MCV: 93.8 fL (ref 78.0–100.0)
PLATELETS: 350 10*3/uL (ref 150–400)
RBC: 4.35 MIL/uL (ref 4.22–5.81)
RDW: 12.7 % (ref 11.5–15.5)
WBC: 11.6 10*3/uL — AB (ref 4.0–10.5)

## 2015-03-11 LAB — ABO/RH: ABO/RH(D): A POS

## 2015-03-11 LAB — CDS SEROLOGY

## 2015-03-11 LAB — PROTIME-INR
INR: 0.96 (ref 0.00–1.49)
PROTHROMBIN TIME: 13 s (ref 11.6–15.2)

## 2015-03-11 LAB — TYPE AND SCREEN
ABO/RH(D): A POS
Antibody Screen: NEGATIVE

## 2015-03-11 MED ORDER — OXYCODONE-ACETAMINOPHEN 5-325 MG PO TABS: 1.0000 | ORAL_TABLET | ORAL | Status: DC | PRN

## 2015-03-11 MED ORDER — HYDROMORPHONE HCL 1 MG/ML IJ SOLN
1.0000 mg | Freq: Once | INTRAMUSCULAR | Status: AC
  Administered 2015-03-11: 1 mg via INTRAVENOUS
  Filled 2015-03-11: qty 1

## 2015-03-11 MED ORDER — OXYCODONE-ACETAMINOPHEN 5-325 MG PO TABS
2.0000 | ORAL_TABLET | Freq: Once | ORAL | Status: AC
  Administered 2015-03-11: 2 via ORAL
  Filled 2015-03-11: qty 2

## 2015-03-11 NOTE — Progress Notes (Signed)
Chaplain responded to level II trauma.  Pt fell from ladder at work.  Chaplain met pt's family outside of room, RN said family could now return.  Chaplain provided emotional support for pt and family at bedside and is available to follow up as needed.    03/11/15 2000  Clinical Encounter Type  Visited With Patient and family together;Health care provider  Visit Type Initial;Trauma  Spiritual Encounters  Spiritual Needs Emotional  Stress Factors  Patient Stress Factors Health changes  Family Stress Factors None identified   Geralyn Flash 03/11/2015 8:56 PM

## 2015-03-11 NOTE — ED Notes (Signed)
Pt transported to xray 

## 2015-03-11 NOTE — ED Provider Notes (Signed)
CSN: [id number]     Arrival date & time 03/11/15  1652 History   First MD Initiated Contact with Patient 03/11/15 1709     Chief Complaint  Patient presents with  . Fall  . Trauma   Patient is a 24 y.o. male presenting with general illness. The history is provided by the patient. No language interpreter was used.  Illness Location:  Back & R hip Quality:  Pain Severity:  Moderate Onset quality:  Sudden Timing:  Constant Progression:  Unchanged Chronicity:  New Context:  Fall. Patient states that earlier today he was on a 20 foot ladder when the ladder gave way and he fell impacting his right hip and lower back on concrete. Pt denies hitting head, LOC, amnesia to event, HA, vision changes, N/V, numbness, tingling, and focal weakness. No Hx of bleeding disorder. Pt is not taking anticoagulants or antiplatelets. Patient has been hematuria since then. Complaining of pain in right hip and lower back. Denies previous history of leg/back fractures or surgeries. Associated symptoms: no abdominal pain, no chest pain, no diarrhea, no fever, no headaches, no loss of consciousness, no nausea and no shortness of breath     Past Medical History  Diagnosis Date  . High cholesterol   . Depression   . Anxiety    Past Surgical History  Procedure Laterality Date  . Knee surgery    . I&d extremity Right 01/14/2015    Procedure: Incision and Drainage RIGHT INDEX FINGER;  Surgeon: Dairl Ponder, MD;  Location: [facility] OR;  Service: Orthopedics;  Laterality: Right;   No family history on file. Social History  Substance Use Topics  . Smoking status: Current Every Day Smoker -- 0.50 packs/day    Types: Cigarettes  . Smokeless tobacco: Never Used  . Alcohol Use: No    Review of Systems  Constitutional: Negative for fever and chills.  Respiratory: Negative for shortness of breath.   Cardiovascular: Negative for chest pain.  Gastrointestinal: Negative for nausea, abdominal pain and diarrhea.   Musculoskeletal: Positive for back pain and arthralgias.  Neurological: Negative for loss of consciousness, syncope and headaches.  All other systems reviewed and are negative.   Allergies  Bactrim; Imitrex; Robaxin; and Toradol  Home Medications   Prior to Admission medications   Medication Sig Start Date End Date Taking? Authorizing Provider  ALPRAZolam Prudy Feeler) 0.5 MG tablet Take 0.5 mg by mouth 2 (two) times daily.    Historical Provider, MD  oxyCODONE-acetaminophen (ROXICET) 5-325 MG per tablet Take 1 tablet by mouth every 4 (four) hours as needed for severe pain. 03/11/15   Angelina Ok, MD  PARoxetine HCl (PAXIL PO) Take 50 mg by mouth daily.    Historical Provider, MD   BP 134/90 mmHg  Pulse 75  Temp(Src) 98.4 F (36.9 C) (Oral)  Resp 12  Ht  (1.854 m)  Wt 165 lb (74.844 kg)  BMI 21.77 kg/m2  SpO2 100%   Physical Exam  Constitutional: He is oriented to person, place, and time. He appears well-developed and well-nourished.  HENT:  Head: Normocephalic and atraumatic.  Eyes: Conjunctivae are normal. Pupils are equal, round, and reactive to light.  Neck: Normal range of motion. Neck supple.  Cardiovascular: Normal rate, regular rhythm and intact distal pulses.   Pulmonary/Chest: Effort normal and breath sounds normal.  Abdominal: Soft. Bowel sounds are normal.  Musculoskeletal: Normal range of motion.  No C-spine midline tenderness. Midline tenderness of T and L-spine. Tenderness to palpation of anterior and lateral  right hip. Neurovascularly intact distally with full range of motion. There is palpation of right olecranon and right wrist  Neurological: He is alert and oriented to person, place, and time. He has normal reflexes. He displays no atrophy, no tremor and normal reflexes. No cranial nerve deficit or sensory deficit. He exhibits normal muscle tone. He displays no seizure activity. Coordination normal.  Reflex Scores:      Tricep reflexes are 2+ on the right  side and 2+ on the left side.      Bicep reflexes are 2+ on the right side and 2+ on the left side.      Brachioradialis reflexes are 2+ on the right side and 2+ on the left side.      Patellar reflexes are 2+ on the right side and 2+ on the left side.      Achilles reflexes are 2+ on the right side and 2+ on the left side. Skin: Skin is warm and dry.  Abrasion to posterior right forearm as well as anterior lotion, no obvious hematoma or ecchymoses or laceration  Nursing note and vitals reviewed.   ED Course  Procedures   Labs Review Labs Reviewed  CBC - Abnormal; Notable for the following:    WBC 11.6 (*)    All other components within normal limits  CDS SEROLOGY  COMPREHENSIVE METABOLIC PANEL  ETHANOL  PROTIME-INR  TYPE AND SCREEN  ABO/RH   Imaging Review Dg Thoracic Spine 2 View  03/11/2015   CLINICAL DATA:  Status post 20 foot fall from a ladder today. Thoracic spine pain. Initial encounter.  EXAM: THORACIC SPINE 2 VIEWS  COMPARISON:  None.  FINDINGS: Images include a swimmer's view. There is no evidence of thoracic spine fracture. Alignment is normal. No other significant bone abnormalities are identified.  IMPRESSION: Negative exam.   Electronically Signed   By: Drusilla Kanner M.D.   On: 03/11/2015 18:53   Dg Lumbar Spine Complete  03/11/2015   CLINICAL DATA:  Status post fall from a 20 foot ladder today. Low back pain. Initial encounter.  EXAM: LUMBAR SPINE - COMPLETE 4+ VIEW  COMPARISON:  None.  FINDINGS: There is no evidence of lumbar spine fracture. Alignment is normal. Intervertebral disc spaces are maintained.  IMPRESSION: Negative exam.   Electronically Signed   By: Drusilla Kanner M.D.   On: 03/11/2015 18:52   Dg Elbow Complete Right  03/11/2015   CLINICAL DATA:  Status post 20 foot fall from a ladder today. Right elbow pain. Initial encounter.  EXAM: RIGHT ELBOW - COMPLETE 3+ VIEW  COMPARISON:  None.  FINDINGS: There is no evidence of fracture, dislocation, or joint  effusion. There is no evidence of arthropathy or other focal bone abnormality. Soft tissues are unremarkable.  IMPRESSION: Negative exam.   Electronically Signed   By: Drusilla Kanner M.D.   On: 03/11/2015 18:54   Dg Wrist Complete Right  03/11/2015   CLINICAL DATA:  Pt fell off 44ft ladder today onto concrete. Pain in right wrist, elbow has scrapes, most pain is right lower back.  EXAM: RIGHT WRIST - COMPLETE 3+ VIEW  COMPARISON:  None.  FINDINGS: There is no evidence of fracture or dislocation. There is no evidence of arthropathy or other focal bone abnormality. Soft tissues are unremarkable.  IMPRESSION: Negative.   Electronically Signed   By: Esperanza Heir M.D.   On: 03/11/2015 18:54   Dg Pelvis Portable  03/11/2015   CLINICAL DATA:  Patient and generalized right-sided pain.  Status post fall. Initial encounter.  EXAM: PORTABLE PELVIS 1-2 VIEWS  COMPARISON:  None.  FINDINGS: There is no evidence of pelvic fracture or diastasis. No pelvic bone lesions are seen.  IMPRESSION: Negative.   Electronically Signed   By: Annia Belt M.D.   On: 03/11/2015 17:32   Dg Chest Portable 1 View  03/11/2015   CLINICAL DATA:  Status post fall 20 feet to the ground. Generalized right side pain.  EXAM: PORTABLE CHEST - 1 VIEW  COMPARISON:  None.  FINDINGS: The heart size and mediastinal contours are within normal limits. Both lungs are clear. The visualized skeletal structures are unremarkable.  IMPRESSION: No active cardiopulmonary disease.   Electronically Signed   By: Sherian Rein M.D.   On: 03/11/2015 17:31   Dg Hip Unilat With Pelvis 2-3 Views Right  03/11/2015   CLINICAL DATA:  Status post 20 foot fall from a ladder. Right hip pain. Initial encounter.  EXAM: DG HIP (WITH OR WITHOUT PELVIS) 2-3V RIGHT  COMPARISON:  None.  FINDINGS: Image bones, joints and soft tissues appear normal.  IMPRESSION: Negative exam.   Electronically Signed   By: Drusilla Kanner M.D.   On: 03/11/2015 20:06   I have personally reviewed  and evaluated these images and lab results as part of my medical decision-making.   EKG Interpretation None      MDM  Mr. Forbis 24 yo male presenting s/p fall. Patient states that earlier today he was on a 20 foot ladder when the ladder gave way and he fell impacting his right hip and lower back on concrete. Pt denies hitting head, LOC, amnesia to event, HA, vision changes, N/V, numbness, tingling, and focal weakness. No Hx of bleeding disorder. Pt is not taking anticoagulants or antiplatelets. Patient has been hematuria since then. Complaining of pain in right hip and lower back. Denies previous history of leg/back fractures or surgeries. Denies urinary incontinence or saddle anesthesia.  Exam above notable for young male lying in stretcher in moderate distress secondary to pain. Afebrile. Heart rate 80s. Normotensive. Breathing well on room air and maintaining saturations without supplemental oxygen. Abdomen benign. No C-spine midline tenderness. Midline tenderness of T and L-spine. Tenderness to palpation of anterior and lateral right hip. Neurovascularly intact distally with full range of motion. There is palpation of right olecranon and right wrist. Abrasions to posterior right forearm and anterior left shin without obvious hematoma, ecchymoses or lacerations. Neuro exam nonfocal  Patient given IV analgesia. Labs unremarkable. X-ray thoracic and lumbar spine show no acute fracture or malalignment. X-ray right elbow, right wrist, and right hip show no acute fracture or malalignment.  Patient able to ambulate in room with difficulty. Ordered right wrist splint as well as crutches, but patient refused both - states he has a cane at home. Patient understands risk of not wearing wrist splint and the possibility of a fracture that did not appear on today's x-ray. Patient discharged home in stable condition with a prescription for by mouth analgesia. Patient will follow-up with his PCP for continuation  of right wrist pain. Strict ED return pressures discussed. Patient and family understands and agrees with the plan and has no further questions or concerns this time.  Patient care discussed with followed by my attending, Dr. Benjiman Core  Final diagnoses:  Fall  Right hip pain  Right wrist pain    Angelina Ok, MD 03/12/15 1610  Benjiman Core, MD 03/12/15 2132

## 2015-03-11 NOTE — ED Notes (Addendum)
Pt refused crutches and wrist splint. States that he has them at home.

## 2015-03-11 NOTE — ED Notes (Signed)
Pt from work for fall off ladder while cutting trees, fell approx. 20 feet. No loc, no blood thinners, pt axox 4. Pain to right hip, right leg, right arm and wrist. nad noted.

## 2016-03-04 ENCOUNTER — Emergency Department (HOSPITAL_COMMUNITY): Payer: Self-pay

## 2016-03-04 ENCOUNTER — Encounter (HOSPITAL_COMMUNITY): Payer: Self-pay

## 2016-03-04 ENCOUNTER — Emergency Department (HOSPITAL_COMMUNITY)
Admission: EM | Admit: 2016-03-04 | Discharge: 2016-03-04 | Disposition: A | Discharge status: Home / Self Care | Payer: Self-pay | Attending: Emergency Medicine | Admitting: Emergency Medicine

## 2016-03-04 DIAGNOSIS — W208XXA Other cause of strike by thrown, projected or falling object, initial encounter: Secondary | ICD-10-CM | POA: Insufficient documentation

## 2016-03-04 DIAGNOSIS — S233XXA Sprain of ligaments of thoracic spine, initial encounter: Secondary | ICD-10-CM | POA: Insufficient documentation

## 2016-03-04 DIAGNOSIS — F17210 Nicotine dependence, cigarettes, uncomplicated: Secondary | ICD-10-CM | POA: Insufficient documentation

## 2016-03-04 DIAGNOSIS — Y929 Unspecified place or not applicable: Secondary | ICD-10-CM | POA: Insufficient documentation

## 2016-03-04 DIAGNOSIS — S46912A Strain of unspecified muscle, fascia and tendon at shoulder and upper arm level, left arm, initial encounter: Secondary | ICD-10-CM | POA: Insufficient documentation

## 2016-03-04 DIAGNOSIS — S29012A Strain of muscle and tendon of back wall of thorax, initial encounter: Secondary | ICD-10-CM

## 2016-03-04 DIAGNOSIS — R52 Pain, unspecified: Secondary | ICD-10-CM

## 2016-03-04 DIAGNOSIS — S060X0A Concussion without loss of consciousness, initial encounter: Secondary | ICD-10-CM | POA: Insufficient documentation

## 2016-03-04 DIAGNOSIS — Z79899 Other long term (current) drug therapy: Secondary | ICD-10-CM | POA: Insufficient documentation

## 2016-03-04 DIAGNOSIS — Y939 Activity, unspecified: Secondary | ICD-10-CM | POA: Insufficient documentation

## 2016-03-04 DIAGNOSIS — M5412 Radiculopathy, cervical region: Secondary | ICD-10-CM | POA: Insufficient documentation

## 2016-03-04 DIAGNOSIS — Y990 Civilian activity done for income or pay: Secondary | ICD-10-CM | POA: Insufficient documentation

## 2016-03-04 MED ORDER — PREDNISONE 20 MG PO TABS: 40.0000 mg | ORAL_TABLET | Freq: Every day | ORAL | 0 refills | Status: DC

## 2016-03-04 MED ORDER — OXYCODONE-ACETAMINOPHEN 5-325 MG PO TABS: 1.0000 | ORAL_TABLET | Freq: Three times a day (TID) | ORAL | 0 refills | Status: DC | PRN

## 2016-03-04 MED ORDER — ONDANSETRON HCL 4 MG/2ML IJ SOLN
4.0000 mg | Freq: Once | INTRAMUSCULAR | Status: AC
  Administered 2016-03-04: 4 mg via INTRAVENOUS
  Filled 2016-03-04: qty 2

## 2016-03-04 MED ORDER — FENTANYL CITRATE (PF) 100 MCG/2ML IJ SOLN
75.0000 ug | INTRAMUSCULAR | Status: AC | PRN
  Administered 2016-03-04 (×2): 75 ug via INTRAVENOUS
  Filled 2016-03-04 (×2): qty 2

## 2016-03-04 MED ORDER — DIAZEPAM 5 MG PO TABS
5.0000 mg | ORAL_TABLET | Freq: Once | ORAL | Status: AC
  Administered 2016-03-04: 5 mg via ORAL
  Filled 2016-03-04: qty 1

## 2016-03-04 MED ORDER — PREDNISONE 20 MG PO TABS
60.0000 mg | ORAL_TABLET | Freq: Once | ORAL | Status: AC
  Administered 2016-03-04: 60 mg via ORAL
  Filled 2016-03-04: qty 3

## 2016-03-04 MED ORDER — IBUPROFEN 800 MG PO TABS: 800.0000 mg | ORAL_TABLET | Freq: Three times a day (TID) | ORAL | 0 refills | Status: DC

## 2016-03-04 MED ORDER — SODIUM CHLORIDE 0.9 % IV BOLUS (SEPSIS)
500.0000 mL | Freq: Once | INTRAVENOUS | Status: AC
  Administered 2016-03-04: 500 mL via INTRAVENOUS

## 2016-03-04 MED ORDER — CYCLOBENZAPRINE HCL 10 MG PO TABS: 10.0000 mg | ORAL_TABLET | Freq: Two times a day (BID) | ORAL | 0 refills | Status: DC | PRN

## 2016-03-04 NOTE — ED Triage Notes (Signed)
Pt presents with numbness to L thumb and inner aspect of L arm x 1 arm.  Pt reports on Tuesday, a 45 pound box fell onto his forehead, hyperextending head, pt reports hearing "6-7 bones crack".
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
## 2016-03-04 NOTE — ED Notes (Signed)
Heating pack applied to neck and upper back.

## 2016-07-11 ENCOUNTER — Emergency Department (HOSPITAL_COMMUNITY)
Admission: EM | Admit: 2016-07-11 | Discharge: 2016-07-11 | Discharge status: Against Medical Advice | Payer: Medicaid Other

## 2016-07-11 NOTE — ED Notes (Signed)
Called for pt again

## 2016-07-11 NOTE — ED Notes (Signed)
Called for Pt times 3.No answer

## 2016-07-12 ENCOUNTER — Encounter (HOSPITAL_COMMUNITY): Payer: Self-pay | Admitting: Emergency Medicine

## 2016-07-12 ENCOUNTER — Emergency Department (HOSPITAL_COMMUNITY)
Admission: EM | Admit: 2016-07-12 | Discharge: 2016-07-12 | Disposition: A | Discharge status: Home / Self Care | Payer: Self-pay | Attending: Emergency Medicine | Admitting: Emergency Medicine

## 2016-07-12 ENCOUNTER — Emergency Department (HOSPITAL_COMMUNITY): Payer: Self-pay

## 2016-07-12 DIAGNOSIS — Y999 Unspecified external cause status: Secondary | ICD-10-CM | POA: Insufficient documentation

## 2016-07-12 DIAGNOSIS — Y929 Unspecified place or not applicable: Secondary | ICD-10-CM | POA: Insufficient documentation

## 2016-07-12 DIAGNOSIS — F17210 Nicotine dependence, cigarettes, uncomplicated: Secondary | ICD-10-CM | POA: Insufficient documentation

## 2016-07-12 DIAGNOSIS — R109 Unspecified abdominal pain: Secondary | ICD-10-CM | POA: Insufficient documentation

## 2016-07-12 DIAGNOSIS — Y939 Activity, unspecified: Secondary | ICD-10-CM | POA: Insufficient documentation

## 2016-07-12 MED ORDER — ACETAMINOPHEN 500 MG PO TABS
1000.0000 mg | ORAL_TABLET | Freq: Once | ORAL | Status: AC
  Administered 2016-07-12: 1000 mg via ORAL
  Filled 2016-07-12: qty 2

## 2016-07-12 MED ORDER — TRAMADOL HCL 50 MG PO TABS: 50.0000 mg | ORAL_TABLET | Freq: Four times a day (QID) | ORAL | 0 refills | Status: DC | PRN

## 2016-07-12 MED ORDER — PREDNISONE 20 MG PO TABS: 40.0000 mg | ORAL_TABLET | Freq: Every day | ORAL | 0 refills | Status: DC
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
## 2016-07-12 NOTE — ED Triage Notes (Signed)
Pt did not answer.
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
## 2016-09-03 ENCOUNTER — Emergency Department (HOSPITAL_COMMUNITY)
Admission: EM | Admit: 2016-09-03 | Discharge: 2016-09-03 | Disposition: A | Discharge status: Home / Self Care | Payer: Medicaid Other | Attending: Emergency Medicine | Admitting: Emergency Medicine

## 2016-09-03 ENCOUNTER — Encounter (HOSPITAL_COMMUNITY): Payer: Self-pay

## 2016-09-03 DIAGNOSIS — W57XXXA Bitten or stung by nonvenomous insect and other nonvenomous arthropods, initial encounter: Secondary | ICD-10-CM | POA: Insufficient documentation

## 2016-09-03 DIAGNOSIS — Y999 Unspecified external cause status: Secondary | ICD-10-CM | POA: Insufficient documentation

## 2016-09-03 DIAGNOSIS — Y939 Activity, unspecified: Secondary | ICD-10-CM | POA: Insufficient documentation

## 2016-09-03 DIAGNOSIS — L03116 Cellulitis of left lower limb: Secondary | ICD-10-CM

## 2016-09-03 DIAGNOSIS — Y929 Unspecified place or not applicable: Secondary | ICD-10-CM | POA: Insufficient documentation

## 2016-09-03 MED ORDER — CLINDAMYCIN HCL 150 MG PO CAPS: 300.0000 mg | ORAL_CAPSULE | Freq: Three times a day (TID) | ORAL | 0 refills | Status: AC

## 2016-09-03 MED ORDER — HYDROCODONE-ACETAMINOPHEN 5-325 MG PO TABS: 1.0000 | ORAL_TABLET | Freq: Four times a day (QID) | ORAL | 0 refills | Status: DC | PRN

## 2016-09-03 MED ORDER — CLINDAMYCIN HCL 150 MG PO CAPS
300.0000 mg | ORAL_CAPSULE | Freq: Once | ORAL | Status: AC
  Administered 2016-09-03: 300 mg via ORAL
  Filled 2016-09-03: qty 2

## 2016-09-03 MED ORDER — ACETAMINOPHEN 500 MG PO TABS
1000.0000 mg | ORAL_TABLET | Freq: Once | ORAL | Status: AC
  Administered 2016-09-03: 1000 mg via ORAL
  Filled 2016-09-03: qty 2

## 2016-09-03 NOTE — ED Notes (Signed)
Pt understood dc material. NAD Noted. SCripts given at dc 

## 2016-09-03 NOTE — ED Provider Notes (Signed)
[facility]EM[facility] DEPT Provider Note   CSN: [id number] Arrival date & time: 09/03/16  2129     History   Chief Complaint Chief Complaint  Patient presents with  . Insect Bite    HPI Keith Daugherty is a 26 y.o. male.  HPI  Patient presents with concern of increasing pain, swelling around a lesion on his left medial distal thigh. Patient is unsure of the cause, but over the past 2 days has had increasing discomfort about this specific area, without other new complaints, including fever, vomiting, loss of sensation or strength in the distal leg. Patient hypothesizes that he was bitten by a spider. He does have nausea, but no vomiting. He states that he is generally well, denies medical problems beyond anxiety.  Past Medical History:  Diagnosis Date  . Anxiety   . Depression   . High cholesterol     There are no active problems to display for this patient.   Past Surgical History:  Procedure Laterality Date  . I&D EXTREMITY Right 01/14/2015   Procedure: Incision and Drainage RIGHT INDEX FINGER;  Surgeon: Dairl PonderMatthew Weingold, MD;  [facility];  Service: Orthopedics;  Laterality: Right;  . KNEE SURGERY         Home Medications    Prior to Admission medications   Medication Sig Start Date End Date Taking? Authorizing Provider  ALPRAZolam Prudy Feeler(XANAX) 0.5 MG tablet Take 1 mg by mouth 3 (three) times daily as needed for anxiety.    Yes Historical Provider, MD  B Complex-C (B-COMPLEX WITH VITAMIN C) tablet Take 1 tablet by mouth daily.   Yes Historical Provider, MD  ECHINACEA PO Take 1 tablet by mouth daily.   Yes Historical Provider, MD  omega-3 acid ethyl esters (LOVAZA) 1 g capsule Take 1 g by mouth daily.   Yes Historical Provider, MD  traMADol (ULTRAM) 50 MG tablet Take 1 tablet (50 mg total) by mouth every 6 (six) hours as needed. Patient taking differently: Take 50 mg by mouth every 6 (six) hours as needed for moderate pain.  07/12/16  Yes Gerhard Munchobert Keita Demarco, MD  vitamin C  (ASCORBIC ACID) 500 MG tablet Take 1,000 mg by mouth daily.   Yes Historical Provider, MD  clindamycin (CLEOCIN) 150 MG capsule Take 2 capsules (300 mg total) by mouth 3 (three) times daily. 09/03/16 09/10/16  Gerhard Munchobert Adarrius Graeff, MD  HYDROcodone-acetaminophen (NORCO/VICODIN) 5-325 MG tablet Take 1 tablet by mouth every 6 (six) hours as needed for severe pain. 09/03/16   Gerhard Munchobert Mylasia Vorhees, MD    Family History No family history on file.  Social History Social History  Substance Use Topics  . Smoking status: Current Every Day Smoker    Packs/day: 0.50    Types: Cigarettes  . Smokeless tobacco: Never Used  . Alcohol use No     Allergies   Bactrim [sulfamethoxazole-trimethoprim]; Imitrex [sumatriptan]; Robaxin [methocarbamol]; Ultram [tramadol]; and Toradol [ketorolac tromethamine]   Review of Systems Review of Systems  Constitutional:       Per HPI, otherwise negative  HENT:       Per HPI, otherwise negative  Respiratory:       Per HPI, otherwise negative  Cardiovascular:       Per HPI, otherwise negative  Gastrointestinal: Positive for nausea. Negative for vomiting.  Endocrine:       Negative aside from HPI  Genitourinary:       Neg aside from HPI   Musculoskeletal:       Per HPI, otherwise negative  Skin:  Positive for color change and wound.  Neurological: Negative for syncope.  Psychiatric/Behavioral: The patient is nervous/anxious.      Physical Exam Updated Vital Signs BP 128/66   Pulse 71   Temp 98.4 F (36.9 C) (Oral)   Resp 16   Ht 6' 1.5" (1.867 m)   Wt 154 lb (69.9 kg)   SpO2 99%   BMI 20.04 kg/m   Physical Exam  Constitutional: He is oriented to person, place, and time. He appears well-developed. No distress.  HENT:  Head: Normocephalic and atraumatic.  Eyes: Conjunctivae and EOM are normal.  Cardiovascular: Normal rate and regular rhythm.   Pulmonary/Chest: Effort normal. No stridor. No respiratory distress.  Abdominal: He exhibits no distension.    Musculoskeletal: He exhibits no edema.  Neurological: He is alert and oriented to person, place, and time.  Skin: Skin is warm and dry.     Psychiatric: He has a normal mood and affect.  Nursing note and vitals reviewed.    ED Treatments / Results   Procedures Procedures (including critical care time)  Medications Ordered in ED Medications  clindamycin (CLEOCIN) capsule 300 mg (300 mg Oral Given 09/03/16 2309)  acetaminophen (TYLENOL) tablet 1,000 mg (1,000 mg Oral Given 09/03/16 2309)     Initial Impression / Assessment and Plan / ED Course  I have reviewed the triage vital signs and the nursing notes.  Pertinent labs & imaging results that were available during my care of the patient were reviewed by me and considered in my medical decision making (see chart for details).  Young male presents with left leg lesion concerning for cellulitis. No area amenable to drainage, incision. Symptoms maybe secondary to prior insect bite, though this is unclear. Patient is otherwise distally or vascular intact, with no evidence for bacteremia, sepsis.   Final Clinical Impressions(s) / ED Diagnoses   Final diagnoses:  Cellulitis of left lower extremity    New Prescriptions New Prescriptions   CLINDAMYCIN (CLEOCIN) 150 MG CAPSULE    Take 2 capsules (300 mg total) by mouth 3 (three) times daily.   HYDROCODONE-ACETAMINOPHEN (NORCO/VICODIN) 5-325 MG TABLET    Take 1 tablet by mouth every 6 (six) hours as needed for severe pain.     Gerhard Munch, MD 09/03/16 [phone]

## 2016-09-03 NOTE — ED Notes (Signed)
ED Provider at bedside. 

## 2016-09-03 NOTE — ED Triage Notes (Signed)
Pt states he believes he got bit by spider and thinks it is infected with Staph; pt states he has seen some yellow pus come out on yesterday; pt states bite was bout 3-4 days ago; Pt has red bump on left inside knee; Pt states pain at 9/10 on arrival.  Pt able to ambulate to triage; Pt is tearful from pain on arrival. Pt denies any other sx at triage.

## 2016-09-03 NOTE — ED Notes (Signed)
Area to medial aspect of left knee marked.  Area is warm, red, painful and swollen.

## 2016-09-08 ENCOUNTER — Emergency Department (HOSPITAL_COMMUNITY)
Admission: EM | Admit: 2016-09-08 | Discharge: 2016-09-09 | Disposition: A | Discharge status: Home / Self Care | Payer: Medicaid Other | Attending: Emergency Medicine | Admitting: Emergency Medicine

## 2016-09-08 ENCOUNTER — Encounter (HOSPITAL_COMMUNITY): Payer: Self-pay | Admitting: Emergency Medicine

## 2016-09-08 DIAGNOSIS — Z4801 Encounter for change or removal of surgical wound dressing: Secondary | ICD-10-CM | POA: Insufficient documentation

## 2016-09-08 DIAGNOSIS — F17210 Nicotine dependence, cigarettes, uncomplicated: Secondary | ICD-10-CM | POA: Insufficient documentation

## 2016-09-08 DIAGNOSIS — Z5189 Encounter for other specified aftercare: Secondary | ICD-10-CM

## 2016-09-08 DIAGNOSIS — M25562 Pain in left knee: Secondary | ICD-10-CM | POA: Insufficient documentation

## 2016-09-08 NOTE — ED Triage Notes (Addendum)
Pt returning to ER for increasing pain from having an abscess to the inner L knee; pt states he was here couple days ago for same; abscess is draining small amt of serosanguinous fluid; pt also states he cannot work like this and requesting a work note

## 2016-09-09 MED ORDER — HYDROCODONE-ACETAMINOPHEN 5-325 MG PO TABS
1.0000 | ORAL_TABLET | Freq: Once | ORAL | Status: AC
  Administered 2016-09-09: 1 via ORAL
  Filled 2016-09-09: qty 1

## 2016-09-09 NOTE — ED Notes (Signed)
Patient is A&Ox4 at this time.  Patient in no signs of distress.  Please see providers note for complete history and physical exam.  

## 2016-09-09 NOTE — Discharge Instructions (Signed)
Continue to take the Clindamycin for infection. Continue to apply warm wet compresses to the area. Follow up with your primary care doctor or return here for worsening symptoms.

## 2016-09-09 NOTE — ED Notes (Signed)
Patient Alert and oriented X4. Stable and ambulatory. Patient verbalized understanding of the discharge instructions.  Patient belongings were taken by the patient.  

## 2016-09-09 NOTE — ED Provider Notes (Signed)
[facility] Provider Note   CSN: [id number] Arrival date & time: 09/08/16  2350     History   Chief Complaint Chief Complaint  Patient presents with  . Knee Pain  . Abscess    HPI Keith Daugherty is a 26 y.o. male who presents to the ED for continued pain of abscess of the left knee. He reports being here last week and evaluated and started on Clindamycin. He did not have I&D at that time. Patient reports increased pain with n/v and fever up to 100. Patient reports that the area has been draining and he tried to pop it and drain it more. He has applied warm compresses to the area.    HPI  Past Medical History:  Diagnosis Date  . Anxiety   . Depression   . High cholesterol     There are no active problems to display for this patient.   Past Surgical History:  Procedure Laterality Date  . I&D EXTREMITY Right 01/14/2015   Procedure: Incision and Drainage RIGHT INDEX FINGER;  Surgeon: Dairl Ponder, MD;  Location: [facility] OR;  Service: Orthopedics;  Laterality: Right;  . KNEE SURGERY         Home Medications    Prior to Admission medications   Medication Sig Start Date End Date Taking? Authorizing Provider  ALPRAZolam Prudy Feeler) 0.5 MG tablet Take 1 mg by mouth 3 (three) times daily as needed for anxiety.     Historical Provider, MD  B Complex-C (B-COMPLEX WITH VITAMIN C) tablet Take 1 tablet by mouth daily.    Historical Provider, MD  clindamycin (CLEOCIN) 150 MG capsule Take 2 capsules (300 mg total) by mouth 3 (three) times daily. 09/03/16 09/10/16  Gerhard Munch, MD  ECHINACEA PO Take 1 tablet by mouth daily.    Historical Provider, MD  HYDROcodone-acetaminophen (NORCO/VICODIN) 5-325 MG tablet Take 1 tablet by mouth every 6 (six) hours as needed for severe pain. 09/03/16   Gerhard Munch, MD  omega-3 acid ethyl esters (LOVAZA) 1 g capsule Take 1 g by mouth daily.    Historical Provider, MD  traMADol (ULTRAM) 50 MG tablet Take 1 tablet (50 mg total) by mouth every 6  (six) hours as needed. Patient taking differently: Take 50 mg by mouth every 6 (six) hours as needed for moderate pain.  07/12/16   Gerhard Munch, MD  vitamin C (ASCORBIC ACID) 500 MG tablet Take 1,000 mg by mouth daily.    Historical Provider, MD    Family History History reviewed. No pertinent family history.  Social History Social History  Substance Use Topics  . Smoking status: Current Every Day Smoker    Packs/day: 0.50    Types: Cigarettes  . Smokeless tobacco: Never Used  . Alcohol use No     Allergies   Bactrim [sulfamethoxazole-trimethoprim]; Imitrex [sumatriptan]; Robaxin [methocarbamol]; Ultram [tramadol]; and Toradol [ketorolac tromethamine]   Review of Systems Review of Systems  Constitutional: Positive for chills and fever.  Gastrointestinal: Positive for nausea and vomiting. Negative for abdominal pain.  Musculoskeletal: Positive for joint swelling.  Skin: Positive for wound.  Psychiatric/Behavioral: Negative for confusion.     Physical Exam Updated Vital Signs BP 102/74   Pulse 78   Temp 98.4 F (36.9 C) (Oral)   Resp 16   SpO2 100%   Physical Exam  Constitutional: He is oriented to person, place, and time. He appears well-developed and well-nourished. No distress.  HENT:  Head: Normocephalic.  Eyes: EOM are normal.  Neck: Neck supple.  Cardiovascular: Normal rate.   Pulmonary/Chest: Effort normal.  Abdominal: Soft. There is no tenderness.  Musculoskeletal: Normal range of motion.       Left knee: He exhibits erythema. Decreased range of motion: due to pain. Tenderness found.       Legs: There is an area of erythema to the medial aspect of the left knee that is inside a circle that was drawn on the previous visit. There is minimal swelling of the knee. There is tenderness with palpation of the patella. Pedal pulse 2+.   Neurological: He is alert and oriented to person, place, and time. No cranial nerve deficit.  Skin: Skin is warm and dry.    Psychiatric: He has a normal mood and affect.  Nursing note and vitals reviewed.    ED Treatments / Results  Labs (all labs ordered are listed, but only abnormal results are displayed) Labs Reviewed - No data to display  Radiology No results found.  Procedures Procedures (including critical care time) Dr. Blinda LeatherwoodPollina in to examine the patient. No concern for joint infection at this time. Patient to continue warm wet compresses and PO antibiotics. He is to f/u with his PCP. Return precautions given. Patient and his mother voice understanding and agree with plan.   Medications Ordered in ED Medications - No data to display   Initial Impression / Assessment and Plan / ED Course  I have reviewed the triage vital signs and the nursing notes.  Final Clinical Impressions(s) / ED Diagnoses   Final diagnoses:  Visit for wound check    New Prescriptions New Prescriptions   No medications on file     [facility] Yakira Duquette, NP 09/09/16 0123    Gilda Creasehristopher J Pollina, MD 09/09/16 [phone]

## 2016-09-21 ENCOUNTER — Encounter (HOSPITAL_COMMUNITY): Payer: Self-pay | Admitting: Emergency Medicine

## 2016-09-21 ENCOUNTER — Emergency Department (HOSPITAL_COMMUNITY)
Admission: EM | Admit: 2016-09-21 | Discharge: 2016-09-22 | Disposition: A | Discharge status: Home / Self Care | Payer: Self-pay | Attending: Emergency Medicine | Admitting: Emergency Medicine

## 2016-09-21 ENCOUNTER — Emergency Department (HOSPITAL_COMMUNITY): Payer: Self-pay

## 2016-09-21 DIAGNOSIS — M25561 Pain in right knee: Secondary | ICD-10-CM | POA: Insufficient documentation

## 2016-09-21 DIAGNOSIS — M546 Pain in thoracic spine: Secondary | ICD-10-CM | POA: Insufficient documentation

## 2016-09-21 DIAGNOSIS — T07XXXA Unspecified multiple injuries, initial encounter: Secondary | ICD-10-CM

## 2016-09-21 DIAGNOSIS — Y999 Unspecified external cause status: Secondary | ICD-10-CM | POA: Insufficient documentation

## 2016-09-21 DIAGNOSIS — T148XXA Other injury of unspecified body region, initial encounter: Secondary | ICD-10-CM | POA: Insufficient documentation

## 2016-09-21 DIAGNOSIS — Y92410 Unspecified street and highway as the place of occurrence of the external cause: Secondary | ICD-10-CM | POA: Insufficient documentation

## 2016-09-21 DIAGNOSIS — Y939 Activity, unspecified: Secondary | ICD-10-CM | POA: Insufficient documentation

## 2016-09-21 DIAGNOSIS — F17210 Nicotine dependence, cigarettes, uncomplicated: Secondary | ICD-10-CM | POA: Insufficient documentation

## 2016-09-21 DIAGNOSIS — S161XXA Strain of muscle, fascia and tendon at neck level, initial encounter: Secondary | ICD-10-CM | POA: Insufficient documentation

## 2016-09-21 MED ORDER — HYDROCODONE-ACETAMINOPHEN 5-325 MG PO TABS: 1.0000 | ORAL_TABLET | Freq: Four times a day (QID) | ORAL | 0 refills | Status: DC | PRN

## 2016-09-21 MED ORDER — HYDROCODONE-ACETAMINOPHEN 5-325 MG PO TABS
1.0000 | ORAL_TABLET | Freq: Once | ORAL | Status: AC
  Administered 2016-09-21: 1 via ORAL
  Filled 2016-09-21: qty 1

## 2016-09-21 MED ORDER — IBUPROFEN 800 MG PO TABS: 800.0000 mg | ORAL_TABLET | Freq: Three times a day (TID) | ORAL | 0 refills | Status: DC | PRN
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
## 2016-09-21 NOTE — ED Notes (Signed)
Pt cussed at staff while getting pt undressed and moved from wheelchair to bed. Pt then later apologized. Told pt we would not tolerate this kind of behavior.

## 2016-09-21 NOTE — Discharge Instructions (Signed)
Return here as needed.  Your x-rays did not show any abnormalities.  Follow-up with a primary doctor or an urgent care.  Use ice and heat on the areas that are sore
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
## 2016-09-21 NOTE — ED Notes (Signed)
On way to CT 

## 2016-09-21 NOTE — ED Notes (Signed)
Pt informed that he is not allowed to eat yet due to having to get another x-ray. Pt verbalized understanding.

## 2016-11-12 ENCOUNTER — Emergency Department (HOSPITAL_COMMUNITY): Payer: No Typology Code available for payment source

## 2016-11-12 ENCOUNTER — Emergency Department (HOSPITAL_COMMUNITY): Payer: No Typology Code available for payment source | Admitting: Certified Registered Nurse Anesthetist

## 2016-11-12 ENCOUNTER — Encounter (HOSPITAL_COMMUNITY)
Admission: EM | Disposition: A | Discharge status: Home / Self Care | Payer: Self-pay | Source: Home / Self Care | Attending: Emergency Medicine

## 2016-11-12 ENCOUNTER — Encounter (HOSPITAL_COMMUNITY): Payer: Self-pay | Admitting: *Deleted

## 2016-11-12 ENCOUNTER — Observation Stay (HOSPITAL_COMMUNITY)
Admission: EM | Admit: 2016-11-12 | Discharge: 2016-11-13 | Disposition: A | Discharge status: Home / Self Care | Payer: No Typology Code available for payment source | Attending: Orthopedic Surgery | Admitting: Orthopedic Surgery

## 2016-11-12 DIAGNOSIS — S81011A Laceration without foreign body, right knee, initial encounter: Secondary | ICD-10-CM | POA: Diagnosis not present

## 2016-11-12 DIAGNOSIS — F419 Anxiety disorder, unspecified: Secondary | ICD-10-CM | POA: Insufficient documentation

## 2016-11-12 DIAGNOSIS — Z882 Allergy status to sulfonamides status: Secondary | ICD-10-CM | POA: Insufficient documentation

## 2016-11-12 DIAGNOSIS — F17210 Nicotine dependence, cigarettes, uncomplicated: Secondary | ICD-10-CM | POA: Insufficient documentation

## 2016-11-12 DIAGNOSIS — S60222A Contusion of left hand, initial encounter: Secondary | ICD-10-CM | POA: Diagnosis not present

## 2016-11-12 DIAGNOSIS — F1290 Cannabis use, unspecified, uncomplicated: Secondary | ICD-10-CM | POA: Diagnosis not present

## 2016-11-12 DIAGNOSIS — Y92410 Unspecified street and highway as the place of occurrence of the external cause: Secondary | ICD-10-CM | POA: Insufficient documentation

## 2016-11-12 DIAGNOSIS — S8981XA Other specified injuries of right lower leg, initial encounter: Secondary | ICD-10-CM | POA: Insufficient documentation

## 2016-11-12 DIAGNOSIS — E7800 Pure hypercholesterolemia, unspecified: Secondary | ICD-10-CM | POA: Insufficient documentation

## 2016-11-12 DIAGNOSIS — Z888 Allergy status to other drugs, medicaments and biological substances status: Secondary | ICD-10-CM | POA: Insufficient documentation

## 2016-11-12 DIAGNOSIS — S91001A Unspecified open wound, right ankle, initial encounter: Secondary | ICD-10-CM

## 2016-11-12 DIAGNOSIS — Z79899 Other long term (current) drug therapy: Secondary | ICD-10-CM | POA: Diagnosis not present

## 2016-11-12 DIAGNOSIS — F329 Major depressive disorder, single episode, unspecified: Secondary | ICD-10-CM | POA: Insufficient documentation

## 2016-11-12 DIAGNOSIS — S81001A Unspecified open wound, right knee, initial encounter: Secondary | ICD-10-CM | POA: Diagnosis present

## 2016-11-12 DIAGNOSIS — S86901A Unspecified injury of unspecified muscle(s) and tendon(s) at lower leg level, right leg, initial encounter: Secondary | ICD-10-CM | POA: Diagnosis present

## 2016-11-12 DIAGNOSIS — S81801A Unspecified open wound, right lower leg, initial encounter: Secondary | ICD-10-CM

## 2016-11-12 DIAGNOSIS — S86801A Unspecified injury of other muscle(s) and tendon(s) at lower leg level, right leg, initial encounter: Secondary | ICD-10-CM

## 2016-11-12 HISTORY — PX: I & D EXTREMITY: SHX5045

## 2016-11-12 LAB — I-STAT CHEM 8, ED
BUN: 19 mg/dL (ref 6–20)
CALCIUM ION: 1.17 mmol/L (ref 1.15–1.40)
Chloride: 100 mmol/L — ABNORMAL LOW (ref 101–111)
Creatinine, Ser: 0.9 mg/dL (ref 0.61–1.24)
Glucose, Bld: 118 mg/dL — ABNORMAL HIGH (ref 65–99)
HEMATOCRIT: 43 % (ref 39.0–52.0)
HEMOGLOBIN: 14.6 g/dL (ref 13.0–17.0)
Potassium: 4 mmol/L (ref 3.5–5.1)
SODIUM: 136 mmol/L (ref 135–145)
TCO2: 28 mmol/L (ref 0–100)

## 2016-11-12 LAB — CBC
HCT: 39.9 % (ref 39.0–52.0)
HCT: 38.3 % — ABNORMAL LOW (ref 39.0–52.0)
HEMOGLOBIN: 13.9 g/dL (ref 13.0–17.0)
HEMOGLOBIN: 13.2 g/dL (ref 13.0–17.0)
MCH: 31.2 pg (ref 26.0–34.0)
MCH: 31.1 pg (ref 26.0–34.0)
MCHC: 34.8 g/dL (ref 30.0–36.0)
MCHC: 34.5 g/dL (ref 30.0–36.0)
MCV: 89.5 fL (ref 78.0–100.0)
MCV: 90.3 fL (ref 78.0–100.0)
PLATELETS: 379 10*3/uL (ref 150–400)
Platelets: 360 10*3/uL (ref 150–400)
RBC: 4.46 MIL/uL (ref 4.22–5.81)
RBC: 4.24 MIL/uL (ref 4.22–5.81)
RDW: 12.7 % (ref 11.5–15.5)
RDW: 12.8 % (ref 11.5–15.5)
WBC: 17 10*3/uL — AB (ref 4.0–10.5)
WBC: 20.3 10*3/uL — AB (ref 4.0–10.5)

## 2016-11-12 LAB — CREATININE, SERUM
CREATININE: 0.87 mg/dL (ref 0.61–1.24)
GFR calc non Af Amer: >60 mL/min (ref >60)

## 2016-11-12 SURGERY — IRRIGATION AND DEBRIDEMENT EXTREMITY
Anesthesia: General | Site: Knee | Laterality: Right

## 2016-11-12 MED ORDER — LACTATED RINGERS IV SOLN
INTRAVENOUS | Status: DC | PRN
  Administered 2016-11-12 (×2): via INTRAVENOUS

## 2016-11-12 MED ORDER — SODIUM CHLORIDE 0.9 % IR SOLN
Status: DC | PRN
  Administered 2016-11-12 (×3): 3000 mL

## 2016-11-12 MED ORDER — KETAMINE HCL-SODIUM CHLORIDE 100-0.9 MG/10ML-% IV SOSY
PREFILLED_SYRINGE | INTRAVENOUS | Status: AC
  Filled 2016-11-12: qty 10

## 2016-11-12 MED ORDER — HYDROMORPHONE HCL 1 MG/ML IJ SOLN
0.5000 mg | INTRAMUSCULAR | Status: DC | PRN
  Administered 2016-11-12 – 2016-11-13 (×4): 1 mg via INTRAVENOUS
  Filled 2016-11-12 (×5): qty 1

## 2016-11-12 MED ORDER — ENOXAPARIN SODIUM 40 MG/0.4ML ~~LOC~~ SOLN
40.0000 mg | SUBCUTANEOUS | Status: DC
  Administered 2016-11-13: 40 mg via SUBCUTANEOUS
  Filled 2016-11-12: qty 0.4

## 2016-11-12 MED ORDER — VITAMIN C 500 MG PO TABS
1000.0000 mg | ORAL_TABLET | Freq: Every day | ORAL | Status: DC
  Filled 2016-11-12: qty 2

## 2016-11-12 MED ORDER — ONDANSETRON HCL 4 MG/2ML IJ SOLN: 4.0000 mg | Freq: Four times a day (QID) | INTRAMUSCULAR | Status: DC | PRN

## 2016-11-12 MED ORDER — LIDOCAINE 2% (20 MG/ML) 5 ML SYRINGE
INTRAMUSCULAR | Status: AC
  Filled 2016-11-12: qty 5

## 2016-11-12 MED ORDER — MIDAZOLAM HCL 2 MG/2ML IJ SOLN
INTRAMUSCULAR | Status: AC
  Filled 2016-11-12: qty 2

## 2016-11-12 MED ORDER — ONDANSETRON HCL 4 MG/2ML IJ SOLN: 4.0000 mg | Freq: Once | INTRAMUSCULAR | Status: DC | PRN

## 2016-11-12 MED ORDER — SUCCINYLCHOLINE CHLORIDE 200 MG/10ML IV SOSY
PREFILLED_SYRINGE | INTRAVENOUS | Status: AC
  Filled 2016-11-12: qty 10

## 2016-11-12 MED ORDER — SENNA 8.6 MG PO TABS
1.0000 | ORAL_TABLET | Freq: Two times a day (BID) | ORAL | Status: DC
  Administered 2016-11-12: 8.6 mg via ORAL
  Filled 2016-11-12 (×2): qty 1

## 2016-11-12 MED ORDER — HYDROCODONE-ACETAMINOPHEN 5-325 MG PO TABS: 1.0000 | ORAL_TABLET | ORAL | 0 refills | Status: AC | PRN

## 2016-11-12 MED ORDER — ONDANSETRON HCL 4 MG PO TABS: 4.0000 mg | ORAL_TABLET | Freq: Three times a day (TID) | ORAL | 0 refills | Status: AC | PRN

## 2016-11-12 MED ORDER — PROPOFOL 10 MG/ML IV BOLUS
INTRAVENOUS | Status: DC | PRN
  Administered 2016-11-12: 150 mg via INTRAVENOUS

## 2016-11-12 MED ORDER — ONDANSETRON HCL 4 MG PO TABS: 4.0000 mg | ORAL_TABLET | Freq: Four times a day (QID) | ORAL | Status: DC | PRN

## 2016-11-12 MED ORDER — ALPRAZOLAM 0.5 MG PO TABS
0.5000 mg | ORAL_TABLET | Freq: Three times a day (TID) | ORAL | Status: DC | PRN
  Administered 2016-11-12 – 2016-11-13 (×2): 0.5 mg via ORAL
  Filled 2016-11-12 (×3): qty 1

## 2016-11-12 MED ORDER — MIDAZOLAM HCL 2 MG/2ML IJ SOLN
INTRAMUSCULAR | Status: DC | PRN
  Administered 2016-11-12: 2 mg via INTRAVENOUS

## 2016-11-12 MED ORDER — ASPIRIN EC 325 MG PO TBEC: 325.0000 mg | DELAYED_RELEASE_TABLET | Freq: Two times a day (BID) | ORAL | 0 refills | Status: AC

## 2016-11-12 MED ORDER — KCL IN DEXTROSE-NACL 20-5-0.45 MEQ/L-%-% IV SOLN
INTRAVENOUS | Status: DC
  Administered 2016-11-13: 04:00:00 via INTRAVENOUS
  Filled 2016-11-12: qty 1000

## 2016-11-12 MED ORDER — ACETAMINOPHEN 650 MG RE SUPP: 650.0000 mg | Freq: Four times a day (QID) | RECTAL | Status: DC | PRN

## 2016-11-12 MED ORDER — METOCLOPRAMIDE HCL 5 MG/ML IJ SOLN: 5.0000 mg | Freq: Three times a day (TID) | INTRAMUSCULAR | Status: DC | PRN

## 2016-11-12 MED ORDER — CEFAZOLIN SODIUM-DEXTROSE 2-4 GM/100ML-% IV SOLN
2.0000 g | Freq: Three times a day (TID) | INTRAVENOUS | Status: DC
  Administered 2016-11-12 – 2016-11-13 (×2): 2 g via INTRAVENOUS
  Filled 2016-11-12 (×3): qty 100

## 2016-11-12 MED ORDER — MORPHINE SULFATE (PF) 4 MG/ML IV SOLN
4.0000 mg | Freq: Once | INTRAVENOUS | Status: AC
Start: 2016-11-12 — End: 2016-11-12
  Administered 2016-11-12: 4 mg via INTRAVENOUS
  Filled 2016-11-12: qty 1

## 2016-11-12 MED ORDER — IOPAMIDOL (ISOVUE-300) INJECTION 61%
INTRAVENOUS | Status: AC
  Administered 2016-11-12: 100 mL
  Filled 2016-11-12: qty 100

## 2016-11-12 MED ORDER — DOCUSATE SODIUM 100 MG PO CAPS: 100.0000 mg | ORAL_CAPSULE | Freq: Two times a day (BID) | ORAL | 3 refills | Status: AC

## 2016-11-12 MED ORDER — ACETAMINOPHEN 325 MG PO TABS
650.0000 mg | ORAL_TABLET | Freq: Four times a day (QID) | ORAL | Status: DC | PRN
  Administered 2016-11-13: 650 mg via ORAL
  Filled 2016-11-12: qty 2

## 2016-11-12 MED ORDER — CEFAZOLIN SODIUM-DEXTROSE 1-4 GM/50ML-% IV SOLN
1.0000 g | Freq: Once | INTRAVENOUS | Status: AC
  Administered 2016-11-12: 1 g via INTRAVENOUS
  Filled 2016-11-12: qty 50

## 2016-11-12 MED ORDER — CHLORHEXIDINE GLUCONATE 4 % EX LIQD: 60.0000 mL | Freq: Once | CUTANEOUS | Status: DC

## 2016-11-12 MED ORDER — SUCCINYLCHOLINE CHLORIDE 200 MG/10ML IV SOSY
PREFILLED_SYRINGE | INTRAVENOUS | Status: DC | PRN
  Administered 2016-11-12: 140 mg via INTRAVENOUS

## 2016-11-12 MED ORDER — FENTANYL CITRATE (PF) 250 MCG/5ML IJ SOLN
INTRAMUSCULAR | Status: AC
  Filled 2016-11-12: qty 5

## 2016-11-12 MED ORDER — HYDROMORPHONE HCL 1 MG/ML IJ SOLN
INTRAMUSCULAR | Status: AC
  Administered 2016-11-12: 0.5 mg via INTRAVENOUS
  Filled 2016-11-12: qty 1

## 2016-11-12 MED ORDER — MEPERIDINE HCL 25 MG/ML IJ SOLN: 6.2500 mg | INTRAMUSCULAR | Status: DC | PRN

## 2016-11-12 MED ORDER — POLYETHYLENE GLYCOL 3350 17 G PO PACK: 17.0000 g | PACK | Freq: Every day | ORAL | Status: DC | PRN

## 2016-11-12 MED ORDER — KETAMINE HCL 10 MG/ML IJ SOLN
INTRAMUSCULAR | Status: DC | PRN
  Administered 2016-11-12: 20 mg via INTRAVENOUS
  Administered 2016-11-12: 30 mg via INTRAVENOUS
  Administered 2016-11-12: 20 mg via INTRAVENOUS
  Administered 2016-11-12: 30 mg via INTRAVENOUS

## 2016-11-12 MED ORDER — LIDOCAINE 2% (20 MG/ML) 5 ML SYRINGE
INTRAMUSCULAR | Status: DC | PRN
  Administered 2016-11-12: 100 mg via INTRAVENOUS

## 2016-11-12 MED ORDER — ONDANSETRON HCL 4 MG/2ML IJ SOLN
INTRAMUSCULAR | Status: DC | PRN
  Administered 2016-11-12: 4 mg via INTRAVENOUS

## 2016-11-12 MED ORDER — HYDROCODONE-ACETAMINOPHEN 5-325 MG PO TABS
1.0000 | ORAL_TABLET | ORAL | Status: DC | PRN
  Administered 2016-11-12 – 2016-11-13 (×4): 2 via ORAL
  Filled 2016-11-12 (×4): qty 2

## 2016-11-12 MED ORDER — CEFAZOLIN SODIUM-DEXTROSE 2-4 GM/100ML-% IV SOLN: 2.0000 g | INTRAVENOUS | Status: DC

## 2016-11-12 MED ORDER — PROPOFOL 10 MG/ML IV BOLUS
INTRAVENOUS | Status: AC
  Filled 2016-11-12: qty 40

## 2016-11-12 MED ORDER — DOCUSATE SODIUM 100 MG PO CAPS
100.0000 mg | ORAL_CAPSULE | Freq: Two times a day (BID) | ORAL | Status: DC
  Administered 2016-11-12: 100 mg via ORAL
  Filled 2016-11-12 (×2): qty 1

## 2016-11-12 MED ORDER — ONDANSETRON HCL 4 MG/2ML IJ SOLN
INTRAMUSCULAR | Status: AC
  Filled 2016-11-12: qty 2

## 2016-11-12 MED ORDER — IBUPROFEN 200 MG PO TABS: 800.0000 mg | ORAL_TABLET | Freq: Three times a day (TID) | ORAL | Status: DC | PRN

## 2016-11-12 MED ORDER — HYDROMORPHONE HCL 1 MG/ML IJ SOLN
0.2500 mg | INTRAMUSCULAR | Status: DC | PRN
  Administered 2016-11-12 (×4): 0.5 mg via INTRAVENOUS

## 2016-11-12 MED ORDER — FENTANYL CITRATE (PF) 100 MCG/2ML IJ SOLN
INTRAMUSCULAR | Status: DC | PRN
  Administered 2016-11-12: 50 ug via INTRAVENOUS
  Administered 2016-11-12: 100 ug via INTRAVENOUS
  Administered 2016-11-12: 50 ug via INTRAVENOUS

## 2016-11-12 MED ORDER — 0.9 % SODIUM CHLORIDE (POUR BTL) OPTIME
TOPICAL | Status: DC | PRN
  Administered 2016-11-12: 1000 mL

## 2016-11-12 MED ORDER — SENNA 8.6 MG PO TABS: 2.0000 | ORAL_TABLET | Freq: Every day | ORAL | 3 refills | Status: AC

## 2016-11-12 MED ORDER — METOCLOPRAMIDE HCL 5 MG PO TABS: 5.0000 mg | ORAL_TABLET | Freq: Three times a day (TID) | ORAL | Status: DC | PRN

## 2016-11-12 SURGICAL SUPPLY — 48 items
BANDAGE ACE 6X5 VEL STRL LF (GAUZE/BANDAGES/DRESSINGS) ×3 IMPLANT
BLADE SURG 10 STRL SS (BLADE) IMPLANT
BNDG COHESIVE 4X5 TAN STRL (GAUZE/BANDAGES/DRESSINGS) IMPLANT
BNDG COHESIVE 6X5 TAN STRL LF (GAUZE/BANDAGES/DRESSINGS) IMPLANT
BNDG GAUZE ELAST 4 BULKY (GAUZE/BANDAGES/DRESSINGS) ×3 IMPLANT
COVER SURGICAL LIGHT HANDLE (MISCELLANEOUS) ×3 IMPLANT
DRAPE HIP W/POCKET STRL (DRAPE) IMPLANT
DRAPE U-SHAPE 47X51 STRL (DRAPES) ×3 IMPLANT
DRSG ADAPTIC 3X8 NADH LF (GAUZE/BANDAGES/DRESSINGS) ×3 IMPLANT
DRSG PAD ABDOMINAL 8X10 ST (GAUZE/BANDAGES/DRESSINGS) ×3 IMPLANT
DURAPREP 26ML APPLICATOR (WOUND CARE) IMPLANT
ELECT CAUTERY BLADE 6.4 (BLADE) ×3 IMPLANT
ELECT REM PT RETURN 9FT ADLT (ELECTROSURGICAL) ×3
ELECTRODE REM PT RTRN 9FT ADLT (ELECTROSURGICAL) ×1 IMPLANT
GAUZE SPONGE 4X4 12PLY STRL (GAUZE/BANDAGES/DRESSINGS) ×3 IMPLANT
GLOVE BIO SURGEON STRL SZ8.5 (GLOVE) ×6 IMPLANT
GLOVE BIOGEL PI IND STRL 8.5 (GLOVE) ×1 IMPLANT
GLOVE BIOGEL PI INDICATOR 8.5 (GLOVE) ×2
GOWN STRL REUS W/ TWL XL LVL3 (GOWN DISPOSABLE) ×1 IMPLANT
GOWN STRL REUS W/TWL 2XL LVL3 (GOWN DISPOSABLE) ×3 IMPLANT
GOWN STRL REUS W/TWL XL LVL3 (GOWN DISPOSABLE) ×2
HANDPIECE INTERPULSE COAX TIP (DISPOSABLE)
IMMOBILIZER KNEE 22 (SOFTGOODS) ×3 IMPLANT
KIT BASIN OR (CUSTOM PROCEDURE TRAY) ×3 IMPLANT
KIT ROOM TURNOVER OR (KITS) ×3 IMPLANT
MANIFOLD NEPTUNE II (INSTRUMENTS) ×3 IMPLANT
NS IRRIG 1000ML POUR BTL (IV SOLUTION) ×3 IMPLANT
PACK GENERAL/GYN (CUSTOM PROCEDURE TRAY) IMPLANT
PACK ORTHO EXTREMITY (CUSTOM PROCEDURE TRAY) ×3 IMPLANT
PACK UNIVERSAL I (CUSTOM PROCEDURE TRAY) IMPLANT
PAD ARMBOARD 7.5X6 YLW CONV (MISCELLANEOUS) ×6 IMPLANT
PAD CAST 4YDX4 CTTN HI CHSV (CAST SUPPLIES) ×1 IMPLANT
PADDING CAST COTTON 4X4 STRL (CAST SUPPLIES) ×2
SET HNDPC FAN SPRY TIP SCT (DISPOSABLE) IMPLANT
STOCKINETTE IMPERVIOUS 9X36 MD (GAUZE/BANDAGES/DRESSINGS) ×3 IMPLANT
SUT ETHILON 2 0 PSLX (SUTURE) ×3 IMPLANT
SUT MON AB 2-0 CT1 36 (SUTURE) ×3 IMPLANT
SUT PDS AB 1 CT  36 (SUTURE) ×2
SUT PDS AB 1 CT 36 (SUTURE) ×1 IMPLANT
SWAB COLLECTION DEVICE MRSA (MISCELLANEOUS) IMPLANT
SWAB CULTURE ESWAB REG 1ML (MISCELLANEOUS) IMPLANT
TOWEL OR 17X24 6PK STRL BLUE (TOWEL DISPOSABLE) IMPLANT
TOWEL OR 17X26 10 PK STRL BLUE (TOWEL DISPOSABLE) ×3 IMPLANT
TUBE CONNECTING 12'X1/4 (SUCTIONS) ×1
TUBE CONNECTING 12X1/4 (SUCTIONS) ×2 IMPLANT
UNDERPAD 30X30 (UNDERPADS AND DIAPERS) ×3 IMPLANT
WATER STERILE IRR 1000ML POUR (IV SOLUTION) ×3 IMPLANT
YANKAUER SUCT BULB TIP NO VENT (SUCTIONS) ×3 IMPLANT

## 2016-11-12 NOTE — Transfer of Care (Signed)
Immediate Anesthesia Transfer of Care Note  Patient: Keith Daugherty  Procedure(s) Performed: Procedure(s): IRRIGATION AND DEBRIDEMENT EXTREMITY (Right)  Patient Location: PACU  Anesthesia Type:General  Level of Consciousness: drowsy and patient cooperative  Airway & Oxygen Therapy: Patient Spontanous Breathing and Patient connected to face mask oxygen  Post-op Assessment: Report given to RN and Post -op Vital signs reviewed and stable  Post vital signs: Reviewed and stable  Last Vitals:  Vitals:   11/12/16 1200 11/12/16 1215  BP: (!) 145/89 (!) 144/82  Pulse: (!) 102 (!) 112  Resp:      Last Pain:  Vitals:   11/12/16 0945  PainSc: 6          Complications: No apparent anesthesia complications
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
## 2016-11-12 NOTE — Anesthesia Preprocedure Evaluation (Signed)
Anesthesia Evaluation  Patient identified by MRN, date of birth, ID band Patient awake    Reviewed: Allergy & Precautions, NPO status , Patient's Chart, lab work & pertinent test results  Airway Mallampati: I  TM Distance: >3 FB Neck ROM: Full    Dental   Pulmonary Current Smoker,    Pulmonary exam normal        Cardiovascular Normal cardiovascular exam     Neuro/Psych Anxiety Depression    GI/Hepatic   Endo/Other    Renal/GU      Musculoskeletal   Abdominal   Peds  Hematology   Anesthesia Other Findings   Reproductive/Obstetrics                             Anesthesia Physical Anesthesia Plan  ASA: II and emergent  Anesthesia Plan: General   Post-op Pain Management:    Induction: Intravenous  Airway Management Planned: Oral ETT  Additional Equipment:   Intra-op Plan:   Post-operative Plan: Extubation in OR  Informed Consent: I have reviewed the patients History and Physical, chart, labs and discussed the procedure including the risks, benefits and alternatives for the proposed anesthesia with the patient or authorized representative who has indicated his/her understanding and acceptance.     Plan Discussed with: CRNA and Surgeon  Anesthesia Plan Comments:         Anesthesia Quick Evaluation
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
## 2016-11-12 NOTE — ED Notes (Signed)
Patient transported to X-ray 

## 2016-11-12 NOTE — Anesthesia Procedure Notes (Signed)
Procedure Name: Intubation Date/Time: 11/12/2016 1:00 PM Performed by: Geraldo DockerSOLHEIM, Rhyleigh Grassel SALOMON Pre-anesthesia Checklist: Patient identified, Patient being monitored, Timeout performed, Emergency Drugs available and Suction available Patient Re-evaluated:Patient Re-evaluated prior to inductionOxygen Delivery Method: Circle System Utilized Preoxygenation: Pre-oxygenation with 100% oxygen Intubation Type: IV induction, Rapid sequence and Cricoid Pressure applied Laryngoscope Size: Miller and 3 Grade View: Grade I Tube type: Oral Tube size: 7.5 mm Number of attempts: 1 Airway Equipment and Method: Stylet Placement Confirmation: ETT inserted through vocal cords under direct vision,  positive ETCO2 and breath sounds checked- equal and bilateral Secured at: 23 cm Tube secured with: Tape Dental Injury: Teeth and Oropharynx as per pre-operative assessment

## 2016-11-12 NOTE — Anesthesia Postprocedure Evaluation (Signed)
Anesthesia Post Note  Patient: Keith Daugherty  Procedure(s) Performed: Procedure(s) (LRB): IRRIGATION AND DEBRIDEMENT EXTREMITY (Right)  Patient location during evaluation: PACU Anesthesia Type: General Level of consciousness: awake and alert Pain management: pain level controlled Vital Signs Assessment: post-procedure vital signs reviewed and stable Respiratory status: spontaneous breathing, nonlabored ventilation, respiratory function stable and patient connected to nasal cannula oxygen Cardiovascular status: blood pressure returned to baseline and stable Postop Assessment: no signs of nausea or vomiting Anesthetic complications: no       Last Vitals:  Vitals:   11/12/16 1504 11/12/16 1514  BP: (!) 155/102 (!) 160/94  Pulse: 81 92  Resp: 11 17  Temp:      Last Pain:  Vitals:   11/12/16 1504  PainSc: Asleep                 Clover Feehan DAVID

## 2016-11-12 NOTE — Discharge Instructions (Signed)
Weight bearing as tolerated right leg Wear knee immobilizer at all times

## 2016-11-12 NOTE — Progress Notes (Signed)
Pt c/o pain  Of 6-7/10 pain when nurse at bedside & wakes pt up to ask , but drifts off to sleep after replying- Dr Michelle Piperssey informed via phone
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
## 2016-11-13 ENCOUNTER — Encounter (HOSPITAL_COMMUNITY): Payer: Self-pay | Admitting: Orthopedic Surgery
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
## 2016-11-13 NOTE — Progress Notes (Signed)
   Subjective:  Patient reports pain as moderate.  Patient anxious - asking to go outside to smoke.  Objective:   VITALS:   Vitals:   11/12/16 1514 11/12/16 1700 11/12/16 2052 11/13/16 0507  BP: (!) 160/94 (!) 172/100 (!) 144/81 130/69  Pulse: 92 87 100 86  Resp: 17 18 18 18   Temp:  97.7 F (36.5 C) 98.8 F (37.1 C) 98.3 F (36.8 C)  TempSrc:  Axillary Oral Oral  SpO2: 100% 100% 99% 98%  Weight:        NAD ABD soft Sensation intact distally Intact pulses distally Dorsiflexion/Plantar flexion intact Incision: dressing C/D/I KI intact  Lab Results  Component Value Date   WBC 20.3 (H) 11/12/2016   HGB 13.2 11/12/2016   HCT 38.3 (L) 11/12/2016   MCV 90.3 11/12/2016   PLT 360 11/12/2016   BMET    Component Value Date/Time   NA 136 11/12/2016 1026   K 4.0 11/12/2016 1026   CL 100 (L) 11/12/2016 1026   CO2 26 03/11/2015 1727   GLUCOSE 118 (H) 11/12/2016 1026   BUN 19 11/12/2016 1026   CREATININE 0.87 11/12/2016 1808   CALCIUM 9.7 03/11/2015 1727   GFRNONAA >60 11/12/2016 1808   GFRAA >60 11/12/2016 1808     Assessment/Plan: 1 Day Post-Op   Active Problems:   Open wound of knee, leg, and ankle, with tendon involvement, right, initial encounter   WBAT RLE in KI KI at all times Lovenox in house - home on ASA PO pain control PT eval Smoking cessation D/C home today after IV abx completed   Lamanda Rudder, Cloyde ReamsBrian James 11/13/2016, 7:41 AM   Samson FredericBrian Jalasia Eskridge, MD Cell [phone]
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
## 2016-11-13 NOTE — Progress Notes (Signed)
Attempted to give d/c instructions to pt. Pt not agreeable and angry. Family present as well. Pt verbally aggressive towards them. Insists on taking packet and "just getting the fuck out of here." Pt refused to go down in wheelchair with staff. Pt left ambulatory with walker. Staff followed him with wheelchair just in case.

## 2017-10-08 ENCOUNTER — Emergency Department (HOSPITAL_COMMUNITY)
Admission: EM | Admit: 2017-10-08 | Discharge: 2017-10-08 | Disposition: A | Discharge status: Home / Self Care | Payer: Self-pay | Attending: Emergency Medicine | Admitting: Emergency Medicine

## 2017-10-08 ENCOUNTER — Encounter (HOSPITAL_COMMUNITY): Payer: Self-pay | Admitting: Emergency Medicine

## 2017-10-08 DIAGNOSIS — F17210 Nicotine dependence, cigarettes, uncomplicated: Secondary | ICD-10-CM | POA: Insufficient documentation

## 2017-10-08 DIAGNOSIS — Z79899 Other long term (current) drug therapy: Secondary | ICD-10-CM | POA: Insufficient documentation

## 2017-10-08 DIAGNOSIS — F1910 Other psychoactive substance abuse, uncomplicated: Secondary | ICD-10-CM | POA: Insufficient documentation

## 2017-10-08 DIAGNOSIS — Z7982 Long term (current) use of aspirin: Secondary | ICD-10-CM | POA: Insufficient documentation

## 2017-10-08 LAB — COMPREHENSIVE METABOLIC PANEL
ALT: 33 U/L (ref 17–63)
AST: 27 U/L (ref 15–41)
Albumin: 3.5 g/dL (ref 3.5–5.0)
Alkaline Phosphatase: 70 U/L (ref 38–126)
Anion gap: 7 (ref 5–15)
BUN: 15 mg/dL (ref 6–20)
CHLORIDE: 106 mmol/L (ref 101–111)
CO2: 26 mmol/L (ref 22–32)
CREATININE: 0.72 mg/dL (ref 0.61–1.24)
Calcium: 8.6 mg/dL — ABNORMAL LOW (ref 8.9–10.3)
GFR calc Af Amer: >60 mL/min (ref >60)
GFR calc non Af Amer: >60 mL/min (ref >60)
Glucose, Bld: 89 mg/dL (ref 65–99)
POTASSIUM: 4 mmol/L (ref 3.5–5.1)
SODIUM: 139 mmol/L (ref 135–145)
Total Bilirubin: 0.4 mg/dL (ref 0.3–1.2)
Total Protein: 6.5 g/dL (ref 6.5–8.1)

## 2017-10-08 LAB — RAPID URINE DRUG SCREEN, HOSP PERFORMED
AMPHETAMINES: POSITIVE — AB
BENZODIAZEPINES: POSITIVE — AB
Barbiturates: NOT DETECTED
Cocaine: POSITIVE — AB
OPIATES: POSITIVE — AB
TETRAHYDROCANNABINOL: POSITIVE — AB

## 2017-10-08 LAB — ETHANOL: Alcohol, Ethyl (B): <10 mg/dL (ref <10)

## 2017-10-08 LAB — ACETAMINOPHEN LEVEL: Acetaminophen (Tylenol), Serum: <10 ug/mL — ABNORMAL LOW (ref 10–30)

## 2017-10-08 LAB — CBC WITH DIFFERENTIAL/PLATELET
BASOS ABS: 0.1 10*3/uL (ref 0.0–0.1)
Basophils Relative: 1 %
EOS ABS: 0.3 10*3/uL (ref 0.0–0.7)
Eosinophils Relative: 3 %
HCT: 36.8 % — ABNORMAL LOW (ref 39.0–52.0)
Hemoglobin: 11.9 g/dL — ABNORMAL LOW (ref 13.0–17.0)
Lymphocytes Relative: 33 %
Lymphs Abs: 3.5 10*3/uL (ref 0.7–4.0)
MCH: 30.2 pg (ref 26.0–34.0)
MCHC: 32.3 g/dL (ref 30.0–36.0)
MCV: 93.4 fL (ref 78.0–100.0)
Monocytes Absolute: 1.1 10*3/uL — ABNORMAL HIGH (ref 0.1–1.0)
Monocytes Relative: 10 %
Neutro Abs: 5.6 10*3/uL (ref 1.7–7.7)
Neutrophils Relative %: 53 %
PLATELETS: 376 10*3/uL (ref 150–400)
RBC: 3.94 MIL/uL — AB (ref 4.22–5.81)
RDW: 13.5 % (ref 11.5–15.5)
WBC: 10.6 10*3/uL — AB (ref 4.0–10.5)

## 2017-10-08 LAB — SALICYLATE LEVEL

## 2017-10-08 MED ORDER — NALOXONE HCL 4 MG/0.1ML NA LIQD
1.0000 | Freq: Once | NASAL | Status: AC
  Administered 2017-10-08: 1 via NASAL
  Filled 2017-10-08: qty 4

## 2017-10-08 NOTE — ED Notes (Signed)
Pt ambulated in hall with help of [employer]. Pt still swaying side to side and unable to keep eyes open while walking.

## 2017-10-08 NOTE — ED Provider Notes (Signed)
He received from Dr. Anitra LauthPlunkett at 1600.  Patient checked out to me as polysubstance overdose without call.  Positive for opiates, cocaine, benzodiazepines, amphetamines, THC.  Wanted to Narcan.  Is now 6 hours status post Narcan.  He has been somnolent but arousable.  Not hypoxemic, tachycardic, or hypotensive.  My last reexamination shows him ambulatory to the bathroom with 1 of our techs.  He is independent with his gait.  He is steady on his feet.  He is of normal mental status and appropriate for discharge.  He has been served and released by [employer].   Rolland PorterJames, Shakeila Pfarr, MD 10/08/17 2224

## 2017-10-08 NOTE — Discharge Instructions (Signed)
Abusing multiple substances is a dangerous activity.   Every day that you abuse drugs is an immediate threat to your life.

## 2017-10-08 NOTE — ED Notes (Signed)
Pt now awake and requesting to be discharged.

## 2017-10-08 NOTE — ED Notes (Signed)
Pt ambulated in hall. NT is informing ED provider now.
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
## 2017-10-08 NOTE — ED Notes (Signed)
Ankle restraints removed from pt at this time. Pt laying in bed eyes closed equal chest rise and fall no acute distress. Pt visible from nursing station.
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
